# Patient Record
Sex: Female | Born: 1935 | Race: White | Hispanic: No | Smoking: Never smoker
Health system: Southern US, Community
[De-identification: ages and names within clinical notes are randomized; demographics above are authoritative.]

## PROBLEM LIST (undated history)

## (undated) DIAGNOSIS — H3320 Serous retinal detachment, unspecified eye: Secondary | ICD-10-CM

## (undated) DIAGNOSIS — E785 Hyperlipidemia, unspecified: Secondary | ICD-10-CM

## (undated) DIAGNOSIS — E7800 Pure hypercholesterolemia, unspecified: Secondary | ICD-10-CM

## (undated) HISTORY — DX: Pure hypercholesterolemia, unspecified: E78.00

## (undated) HISTORY — PX: OTHER SURGICAL HISTORY: SHX169

## (undated) HISTORY — DX: Hyperlipidemia, unspecified: E78.5

---

## 1997-06-14 ENCOUNTER — Ambulatory Visit (HOSPITAL_COMMUNITY): Admission: RE | Admit: 1997-06-14 | Discharge: 1997-06-14 | Payer: Self-pay | Admitting: *Deleted

## 1997-06-21 ENCOUNTER — Ambulatory Visit (HOSPITAL_COMMUNITY): Admission: RE | Admit: 1997-06-21 | Discharge: 1997-06-21 | Payer: Self-pay | Admitting: *Deleted

## 1998-01-30 ENCOUNTER — Ambulatory Visit (HOSPITAL_COMMUNITY): Admission: RE | Admit: 1998-01-30 | Discharge: 1998-01-30 | Payer: Self-pay | Admitting: *Deleted

## 1998-09-22 ENCOUNTER — Ambulatory Visit (HOSPITAL_COMMUNITY): Admission: RE | Admit: 1998-09-22 | Discharge: 1998-09-22 | Payer: Self-pay | Admitting: *Deleted

## 1998-09-22 ENCOUNTER — Encounter: Payer: Self-pay | Admitting: *Deleted

## 1998-12-29 ENCOUNTER — Other Ambulatory Visit: Admission: RE | Admit: 1998-12-29 | Discharge: 1998-12-29 | Payer: Self-pay | Admitting: *Deleted

## 1999-04-16 ENCOUNTER — Other Ambulatory Visit: Admission: RE | Admit: 1999-04-16 | Discharge: 1999-04-16 | Payer: Self-pay | Admitting: *Deleted

## 1999-10-20 ENCOUNTER — Encounter: Payer: Self-pay | Admitting: Obstetrics and Gynecology

## 1999-10-20 ENCOUNTER — Ambulatory Visit (HOSPITAL_COMMUNITY): Admission: RE | Admit: 1999-10-20 | Discharge: 1999-10-20 | Payer: Self-pay | Admitting: Obstetrics and Gynecology

## 1999-12-02 ENCOUNTER — Ambulatory Visit (HOSPITAL_COMMUNITY): Admission: RE | Admit: 1999-12-02 | Discharge: 1999-12-02 | Payer: Self-pay | Admitting: Gastroenterology

## 1999-12-31 ENCOUNTER — Encounter (INDEPENDENT_AMBULATORY_CARE_PROVIDER_SITE_OTHER): Payer: Self-pay | Admitting: Specialist

## 1999-12-31 ENCOUNTER — Ambulatory Visit (HOSPITAL_COMMUNITY): Admission: RE | Admit: 1999-12-31 | Discharge: 1999-12-31 | Payer: Self-pay | Admitting: Obstetrics and Gynecology

## 2001-01-05 ENCOUNTER — Ambulatory Visit (HOSPITAL_COMMUNITY): Admission: RE | Admit: 2001-01-05 | Discharge: 2001-01-05 | Payer: Self-pay | Admitting: Obstetrics and Gynecology

## 2001-01-05 ENCOUNTER — Encounter: Payer: Self-pay | Admitting: Obstetrics and Gynecology

## 2001-11-20 ENCOUNTER — Encounter: Admission: RE | Admit: 2001-11-20 | Discharge: 2001-11-20 | Payer: Self-pay | Admitting: Internal Medicine

## 2001-11-20 ENCOUNTER — Ambulatory Visit (HOSPITAL_COMMUNITY): Admission: RE | Admit: 2001-11-20 | Discharge: 2001-11-20 | Payer: Self-pay | Admitting: Internal Medicine

## 2001-11-20 ENCOUNTER — Encounter: Payer: Self-pay | Admitting: Internal Medicine

## 2002-06-21 ENCOUNTER — Encounter: Payer: Self-pay | Admitting: Obstetrics and Gynecology

## 2002-06-21 ENCOUNTER — Ambulatory Visit (HOSPITAL_COMMUNITY): Admission: RE | Admit: 2002-06-21 | Discharge: 2002-06-21 | Payer: Self-pay | Admitting: Obstetrics and Gynecology

## 2003-10-28 ENCOUNTER — Ambulatory Visit (HOSPITAL_COMMUNITY): Admission: RE | Admit: 2003-10-28 | Discharge: 2003-10-28 | Payer: Self-pay | Admitting: Obstetrics and Gynecology

## 2003-12-30 ENCOUNTER — Other Ambulatory Visit: Admission: RE | Admit: 2003-12-30 | Discharge: 2003-12-30 | Payer: Self-pay | Admitting: Obstetrics and Gynecology

## 2004-01-02 ENCOUNTER — Encounter: Admission: RE | Admit: 2004-01-02 | Discharge: 2004-01-02 | Payer: Self-pay | Admitting: Obstetrics and Gynecology

## 2005-01-25 ENCOUNTER — Other Ambulatory Visit: Admission: RE | Admit: 2005-01-25 | Discharge: 2005-01-25 | Payer: Self-pay | Admitting: Obstetrics and Gynecology

## 2005-06-11 ENCOUNTER — Ambulatory Visit (HOSPITAL_COMMUNITY): Admission: RE | Admit: 2005-06-11 | Discharge: 2005-06-11 | Payer: Self-pay | Admitting: Obstetrics and Gynecology

## 2006-05-11 ENCOUNTER — Other Ambulatory Visit: Admission: RE | Admit: 2006-05-11 | Discharge: 2006-05-11 | Payer: Self-pay | Admitting: Obstetrics and Gynecology

## 2006-11-09 ENCOUNTER — Ambulatory Visit (HOSPITAL_COMMUNITY): Admission: RE | Admit: 2006-11-09 | Discharge: 2006-11-09 | Payer: Self-pay | Admitting: Family Medicine

## 2008-12-27 ENCOUNTER — Ambulatory Visit (HOSPITAL_COMMUNITY): Admission: RE | Admit: 2008-12-27 | Discharge: 2008-12-27 | Payer: Self-pay | Admitting: Obstetrics and Gynecology

## 2009-01-24 ENCOUNTER — Other Ambulatory Visit: Admission: RE | Admit: 2009-01-24 | Discharge: 2009-01-24 | Payer: Self-pay | Admitting: Obstetrics and Gynecology

## 2009-12-17 ENCOUNTER — Ambulatory Visit (HOSPITAL_COMMUNITY): Admission: RE | Admit: 2009-12-17 | Discharge: 2009-12-17 | Payer: Self-pay | Admitting: Ophthalmology

## 2010-02-05 ENCOUNTER — Ambulatory Visit (HOSPITAL_COMMUNITY): Admission: AD | Admit: 2010-02-05 | Discharge: 2010-02-05 | Payer: Self-pay | Admitting: Ophthalmology

## 2010-06-15 ENCOUNTER — Ambulatory Visit: Payer: Self-pay | Admitting: Cardiology

## 2010-06-25 ENCOUNTER — Encounter: Payer: Self-pay | Admitting: Cardiology

## 2010-06-25 DIAGNOSIS — E785 Hyperlipidemia, unspecified: Secondary | ICD-10-CM | POA: Insufficient documentation

## 2010-06-25 DIAGNOSIS — I119 Hypertensive heart disease without heart failure: Secondary | ICD-10-CM | POA: Insufficient documentation

## 2010-06-25 DIAGNOSIS — E7800 Pure hypercholesterolemia, unspecified: Secondary | ICD-10-CM | POA: Insufficient documentation

## 2010-07-01 LAB — CBC
MCH: 31 pg (ref 26.0–34.0)
MCHC: 33.8 g/dL (ref 30.0–36.0)
MCV: 91.7 fL (ref 78.0–100.0)
Platelets: 240 10*3/uL (ref 150–400)
RDW: 14.3 % (ref 11.5–15.5)

## 2010-07-01 LAB — BASIC METABOLIC PANEL
BUN: 15 mg/dL (ref 6–23)
CO2: 29 mEq/L (ref 19–32)
Chloride: 103 mEq/L (ref 96–112)
Creatinine, Ser: 1.11 mg/dL (ref 0.4–1.2)
Glucose, Bld: 101 mg/dL — ABNORMAL HIGH (ref 70–99)

## 2010-07-03 LAB — BASIC METABOLIC PANEL
CO2: 26 mEq/L (ref 19–32)
Calcium: 10.5 mg/dL (ref 8.4–10.5)
Creatinine, Ser: 1.19 mg/dL (ref 0.4–1.2)
Glucose, Bld: 110 mg/dL — ABNORMAL HIGH (ref 70–99)
Sodium: 142 mEq/L (ref 135–145)

## 2010-07-03 LAB — CBC
Hemoglobin: 13.8 g/dL (ref 12.0–15.0)
MCH: 30.6 pg (ref 26.0–34.0)
MCHC: 33.5 g/dL (ref 30.0–36.0)

## 2010-07-15 ENCOUNTER — Other Ambulatory Visit (INDEPENDENT_AMBULATORY_CARE_PROVIDER_SITE_OTHER): Payer: Medicare Other | Admitting: *Deleted

## 2010-07-15 DIAGNOSIS — R0789 Other chest pain: Secondary | ICD-10-CM

## 2010-07-15 DIAGNOSIS — E7800 Pure hypercholesterolemia, unspecified: Secondary | ICD-10-CM

## 2010-07-15 LAB — LIPID PANEL
HDL: 55 mg/dL (ref >39)
LDL Cholesterol: 129 mg/dL — ABNORMAL HIGH (ref 0–99)
Total CHOL/HDL Ratio: 4.1 Ratio
Triglycerides: 193 mg/dL — ABNORMAL HIGH (ref <150)
VLDL: 39 mg/dL (ref 0–40)

## 2010-07-15 LAB — COMPREHENSIVE METABOLIC PANEL
Alkaline Phosphatase: 121 U/L — ABNORMAL HIGH (ref 39–117)
Glucose, Bld: 97 mg/dL (ref 70–99)
Sodium: 134 mEq/L — ABNORMAL LOW (ref 135–145)
Total Bilirubin: 0.9 mg/dL (ref 0.3–1.2)
Total Protein: 7 g/dL (ref 6.0–8.3)

## 2010-07-17 ENCOUNTER — Other Ambulatory Visit: Payer: Self-pay

## 2010-07-20 ENCOUNTER — Encounter: Payer: Self-pay | Admitting: Cardiology

## 2010-07-20 ENCOUNTER — Ambulatory Visit (INDEPENDENT_AMBULATORY_CARE_PROVIDER_SITE_OTHER): Payer: Medicare Other | Admitting: Cardiology

## 2010-07-20 VITALS — BP 140/70 | HR 68 | Wt 162.0 lb

## 2010-07-20 DIAGNOSIS — E7800 Pure hypercholesterolemia, unspecified: Secondary | ICD-10-CM

## 2010-07-21 NOTE — Progress Notes (Signed)
Office Note   Name: Christine Davila Date of birth: [birth year] Date of service: 07/20/10  History of present illness: This pleasant middle-aged Caucasian female is seen for a scheduled followup office visit.  She has a past history of high blood pressure and hypercholesterolemia.  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  She's had a total of 4 I operations and she still having difficulty.  She is unable to lie flat on her back because of the treatment for her retinal detachment.  She presently has a bottle of oral being used to try to seal the detachment.  Her ophthalmologist took her off Lipitor because he felt it was bad for her eyes.  She is taking vitamin B super complex.  Medications: Reviewed in detail  Physical exam: Her weight is 162.  The blood pressures 140/70.  The pulse is 68 and regular.  The general appearance reveals a well-developed well-nourished woman in no distress.  Her right eye conjunctiva is slightly reddened. Pupils equal and reactive.   Extraocular Movements are full.  There is no scleral icterus.  The mouth and pharynx are normal.  The neck is supple.  The carotids reveal no bruits.  The jugular venous pressure is normal.  The thyroid is not enlarged.  There is no lymphadenopathy. The chest is clear to percussion and auscultation. There are no rales or rhonchi. Expansion of the chest is symmetrical. The precordium is quiet.  The first heart sound is normal.  The second heart sound is physiologically split.  There is no murmur gallop rub or click.  There is no abnormal lift or heave. The abdomen is soft and nontender. Bowel sounds are normal. The liver and spleen are not enlarged. There Are no abdominal masses. There are no bruits.  Normal extremity without phlebitis or edema.  Impression : Essential hypertension 5 pound weight gain secondary to inability to exercise because of detached  retina Hypercholesterolemia worse because of inability to exercise and because of weight gain   Plan: Continue present medication.  Try to keep diet and weight under control.  Recheck in 6 months for followup office visit fasting lipid panel and chemistries.   Signed by Cassell Clement, M.D.  cc:[default value]

## 2010-09-04 NOTE — H&P (Signed)
[facility]  Patient:    Christine Davila, Christine Davila                   MRN: [id number] Adm. Date:  [id number] Disc. Date: [id number] Attending:  Daine Gip CC:         Recovery Room, [facility]   History and Physical  HISTORY OF PRESENT ILLNESS:  The patient is  76 year old gravida 3, para 3 white female admitted for [facility] hysteroscopy for endometrial polyp.  The patient underwent a sonohistogram and at that time was found to have a 1.0 x 0.4 x 0.8 cm mass thought to be probable polyp.  She has been subsequently admitted for [facility] hysteroscopy to remove the polyp.  Risks of surgery, including anesthetic complication, hemorrhage infection, damage to adjacent structures, including bladder, bowel, blood vessels ureters were discussed with the patient.  She was made ware of the risk of perforation which could result in overwhelming life threatening hemorrhage requiring emergent hysterectomy or uterine perforation which could result in bowel damage requiring emergent colostomy. The patient expresses understanding of and acceptance of these risks.  PAST MEDICAL HISTORY:  History of hypertension, history of ileus treated medically approximately 10 years ago. History of colon polyp which was dysplastic and had been removed.  A recent colonoscopy was negative.  D&C, due to ______  bleeding approximately 30 years ago.  Spontaneous vaginal delivery x 3.  MEDICATIONS:  Accupril 10 mg daily, Claritin 10 mg daily, Estrace 1 mg p.o. q.d., Provera 5 mg p.o. q.d.  ALLERGIES:  No known drug allergies.  FAMILY HISTORY:  History of hypertension in patients mother and father, history of diabetes in the patients mother.  REVIEW OF SYSTEMS:  Negative.  PHYSICAL EXAMINATION:  HEENT:  Normal.  NECK:  Supple without thyromegaly.  LUNGS:  Clear to auscultation.  CARDIAC EXAM:  Regular rate and rhythm.  BREASTS:  Without masses no dimpling or  retraction.  ABDOMEN:  Soft, nontender, no hepatosplenomegaly.  PELVIC EXAM:  Uterus position normal mobile, no adnexal mass palpated. rectal is confirmatory, no mass.  ASSESSMENT AND PLAN:  The patient is a 76 year old gravida 3, para 3 with endometrial polyp admitted for D&C hysteroscopy to remove polyp.  Risks have been explained to the patient and she expresses understanding of it and acceptance of these risks. DD:  12/31/99 TD:  12/31/99 Job: [id number]

## 2010-09-04 NOTE — Op Note (Signed)
[facility]  Patient:    Christine Davila, Christine Davila                   MRN: [id number] Proc. Date: 12/31/99 Adm. Date:  [id number] Attending:  Madelyn Flavors                           Operative Report  PREOPERATIVE DIAGNOSES:       1.  Postmenopausal bleeding.                               2.  Endometrial polyp (on sonohistogram).  POSTOPERATIVE DIAGNOSES:      1.  Postmenopausal bleeding.                               2.  Endometrial polyp (on sonohistogram).  PROCEDURE:                    1.  Dilatation and curettage.                               2.  Hysteroscopy.                               3.  Polypectomy.  SURGEON:                      Beather Arbour. Thomasena Edis, M.D.  ANESTHESIA:                   Monitored anesthesia care, as well as                               10 cc 1% lidocaine.  Paracervical block.  ESTIMATED BLOOD LOSS:         Minimal.  FLUIDS:                       1450 cc Crystalloid.  COMPLICATIONS:                None.  DRAINS:                       None.  DESCRIPTION OF PROCEDURE:     The patient was brought to the operating room and identified on the operating table.  After the patient was adequately sedated with monitored anesthesia care, she was placed in the dorsolithotomy position and prepped and draped in the usual sterile fashion.  The bladder was straight catheterized of approximately 100 cc of clear yellow urine. Examination of uterine and cervix revealed the uterus to be anteverted, 6-weeks in level; no adnexal mass palpated.  The speculum was placed in the anterior lip of the cervix; infiltrated with 1 cc of 1% lidocaine and grasped with a single-tooth tenaculum.  The remaining 9 cc of 1% lidocaine were placed for a paracervical block.  The cervix was very carefully and gently dilated up to a #25 Pratt dilator.  The uterus was sounded to 8 cm.  Using the [facility] and ______ scope, the hysteroscope was placed. Using Sorbitol  as a distending medium, a careful and thorough hysteroscopic  examination was performed.  There was noted to be increased endometrial tissue of the anterior and posterior portions of the uterus.  The polyp was identified.  Using the serrated curet, the uterus was curetted in a systematic clockwise fashion, with tissue obtained.  The polyp was also obtained.  _______stone forceps were placed and additional tissue was obtained and sent to pathology for examination.  The hysteroscope was again placed and the endometrium was noted to have been sampled in its entirety, and the polyp was noted to have been removed in its entirety.  At that point the procedure was then terminated.  The tenaculum was removed.  There was noted to be no bleeding at the tenaculum site.  There was noted to be minimal uterine bleeding.  The patient tolerated the procedure well without apparent complications.  She was transferred to the recovery room in stable condition, after sponge, needle and instrument counts were correct.  She was given the postoperative D&C instruction sheet.  She was urged to call for any problems.  She is urged to take ibuprofen 400-600 mg q.6h. p.r.n. for pain.  She is to call for any problems.  She is to refrain from intercourse for two to three weeks.  She will return to the office in two to three weeks for postoperative evaluation. DD:  12/31/99 TD:  01/01/00 Job: [id number]

## 2010-12-24 ENCOUNTER — Other Ambulatory Visit: Payer: Self-pay | Admitting: Cardiology

## 2010-12-24 DIAGNOSIS — E785 Hyperlipidemia, unspecified: Secondary | ICD-10-CM

## 2010-12-24 DIAGNOSIS — E7800 Pure hypercholesterolemia, unspecified: Secondary | ICD-10-CM

## 2010-12-28 ENCOUNTER — Ambulatory Visit (INDEPENDENT_AMBULATORY_CARE_PROVIDER_SITE_OTHER): Payer: Medicare Other | Admitting: *Deleted

## 2010-12-28 ENCOUNTER — Other Ambulatory Visit: Payer: Medicare Other | Admitting: *Deleted

## 2010-12-28 ENCOUNTER — Telehealth: Payer: Self-pay | Admitting: Cardiology

## 2010-12-28 DIAGNOSIS — E7800 Pure hypercholesterolemia, unspecified: Secondary | ICD-10-CM

## 2010-12-28 DIAGNOSIS — E785 Hyperlipidemia, unspecified: Secondary | ICD-10-CM

## 2010-12-28 DIAGNOSIS — I10 Essential (primary) hypertension: Secondary | ICD-10-CM

## 2010-12-28 LAB — CBC WITH DIFFERENTIAL/PLATELET
Basophils Relative: 0.9 % (ref 0.0–3.0)
Eosinophils Relative: 2 % (ref 0.0–5.0)
HCT: 43.5 % (ref 36.0–46.0)
Hemoglobin: 14.4 g/dL (ref 12.0–15.0)
Lymphs Abs: 1.1 10*3/uL (ref 0.7–4.0)
MCV: 92.5 fl (ref 78.0–100.0)
Monocytes Absolute: 0.6 10*3/uL (ref 0.1–1.0)
RBC: 4.71 Mil/uL (ref 3.87–5.11)
WBC: 6 10*3/uL (ref 4.5–10.5)

## 2010-12-28 NOTE — Telephone Encounter (Signed)
Spoke with June and was told already done.

## 2010-12-28 NOTE — Telephone Encounter (Signed)
Called because she had her blood work done this morning and for some reason Dr. B had not ordered a CBC. They had drawn some extra blood but said that an order needs to be placed before they could process it. No need to call back unless there is an issue.

## 2010-12-29 ENCOUNTER — Other Ambulatory Visit: Payer: Self-pay | Admitting: *Deleted

## 2010-12-30 LAB — HEPATIC FUNCTION PANEL
AST: 26 U/L (ref 0–37)
Albumin: 3.9 g/dL (ref 3.5–5.2)
Total Protein: 6.8 g/dL (ref 6.0–8.3)

## 2010-12-30 LAB — BASIC METABOLIC PANEL
BUN: 17 mg/dL (ref 6–23)
Calcium: 9.7 mg/dL (ref 8.4–10.5)
GFR: 61.02 mL/min (ref >60.00)
Glucose, Bld: 96 mg/dL (ref 70–99)

## 2011-01-01 ENCOUNTER — Telehealth: Payer: Self-pay | Admitting: Cardiology

## 2011-01-01 ENCOUNTER — Ambulatory Visit (INDEPENDENT_AMBULATORY_CARE_PROVIDER_SITE_OTHER): Payer: Medicare Other | Admitting: Cardiology

## 2011-01-01 ENCOUNTER — Encounter: Payer: Self-pay | Admitting: Cardiology

## 2011-01-01 VITALS — BP 136/70 | HR 72 | Wt 159.0 lb

## 2011-01-01 DIAGNOSIS — E785 Hyperlipidemia, unspecified: Secondary | ICD-10-CM

## 2011-01-01 DIAGNOSIS — I119 Hypertensive heart disease without heart failure: Secondary | ICD-10-CM

## 2011-01-01 DIAGNOSIS — E7800 Pure hypercholesterolemia, unspecified: Secondary | ICD-10-CM

## 2011-01-01 DIAGNOSIS — I10 Essential (primary) hypertension: Secondary | ICD-10-CM

## 2011-01-01 NOTE — Progress Notes (Signed)
Christine Davila Date of Birth:  [birth year] [facility] [address]   [address] [place], [place]  [address] [phone]           Fax   [phone]  HPI: This pleasant 76 year old woman is seen for a scheduled followup office visit.  She has a past history of high blood pressure and hypercholesterolemia.  She did well until August 2011 when she developed a sudden detached retina of her right eye.  She's had multiple surgical procedures and still is not seeing well.  She is due to have some additional laser surgery on her eye in the next several weeks.  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 aspirin 81 MG tablet Take 81 mg by mouth daily.        Marland Kitchen b complex vitamins tablet Take 1 tablet by mouth daily. Taking super b complex daily       . Calcium Carbonate-Vit D-Min (CALCIUM 1200 PO) Take by mouth daily.        . Cholecalciferol (VITAMIN D PO) Take 600 mg by mouth daily.        . cyclobenzaprine (FLEXERIL) 10 MG tablet Take 10 mg by mouth 3 (three) times daily as needed.        . fexofenadine (ALLEGRA) 180 MG tablet Take 180 mg by mouth daily.        . Flaxseed, Linseed, (FLAX SEED OIL PO) Take by mouth. As directed       . GLUCOSAMINE PO Take 750 mg by mouth daily.        Marland Kitchen losartan (COZAAR) 100 MG tablet Take 100 mg by mouth daily.        . metoprolol tartrate (LOPRESSOR) 25 MG tablet Take 25 mg by mouth 2 (two) times daily.        . Multiple Vitamin (MULTIVITAMIN) tablet Take 1 tablet by mouth daily.        . Travoprost, BAK Free, (TRAVATAMN) 0.004 % SOLN ophthalmic solution 1 drop at bedtime.        . Triamterene-HCTZ (MAXZIDE-25 PO) Take by mouth daily.          Allergies  Allergen Reactions  . Ace Inhibitors Cough  . Lipitor (Atorvastatin Calcium)     Eye problems      Patient Active Problem List  Diagnoses  . Essential hypertension  . Hypercholesterolemia  . Hyperlipidemia  . Hyperlipidemia    History  Smoking status  . Never Smoker   Smokeless tobacco  . Not on file    History  Alcohol Use: Not on file    No family history on file.  Review of Systems: The patient denies any heat or cold intolerance.  No weight gain or weight loss.  The patient denies headaches or blurry vision.  There is no cough or sputum production.  The patient denies dizziness.  There is no hematuria or hematochezia.  The patient denies any muscle aches or arthritis.  The patient denies any rash.  The patient denies frequent falling or instability.  There is no history of depression or anxiety.  All other systems were reviewed and are negative.   Physical Exam: Filed Vitals:   01/01/11 0838  BP: 136/70  Pulse: 72  The general appearance reveals a well-developed well-nourished woman  in no distress.Pupils equal and reactive.   Extraocular Movements are full.  There is no scleral icterus.  The mouth and pharynx are normal.  The neck is supple.  The carotids reveal no bruits.  The jugular venous pressure is normal.  The thyroid is not enlarged.  There is no lymphadenopathy.  The chest is clear to percussion and auscultation. There are no rales or rhonchi. Expansion of the chest is symmetrical.  The precordium is quiet.  The first heart sound is normal.  The second heart sound is physiologically split.  There is no murmur gallop rub or click.  There is no abnormal lift or heave.  The abdomen is soft and nontender. Bowel sounds are normal. The liver and spleen are not enlarged. There Are no abdominal masses. There are no bruits.  The pedal pulses are good.  There is no phlebitis or edema.  There is no cyanosis or clubbing.  Strength is normal and symmetrical in all extremities.  There is no lateralizing weakness.  There are no sensory deficits.  The skin is warm and  dry.  There is no rash.      Assessment / Plan: Continue same medication.  Recheck in 6 months for office visit and fasting lab work

## 2011-01-01 NOTE — Telephone Encounter (Signed)
Pt calling back with name of med she is taking anf couldn't remember the name it's flax seed oil

## 2011-01-01 NOTE — Assessment & Plan Note (Signed)
The patient has not been expressing any symptoms from her blood pressure.  Denies headaches or dizzy spells.

## 2011-01-01 NOTE — Assessment & Plan Note (Signed)
The patient is no longer on Lipitor.  She took herself off Lipitor.  She has been taking a garlic pill each day and another supplement.  Her blood work this time is better than last time.  She has also been eating eggs at the encouragement of her eye doctor who thought it might help her retinal disease.

## 2011-04-22 DIAGNOSIS — H3340 Traction detachment of retina, unspecified eye: Secondary | ICD-10-CM | POA: Diagnosis not present

## 2011-04-22 DIAGNOSIS — H35419 Lattice degeneration of retina, unspecified eye: Secondary | ICD-10-CM | POA: Diagnosis not present

## 2011-04-22 DIAGNOSIS — H4720 Unspecified optic atrophy: Secondary | ICD-10-CM | POA: Diagnosis not present

## 2011-04-22 DIAGNOSIS — H40019 Open angle with borderline findings, low risk, unspecified eye: Secondary | ICD-10-CM | POA: Diagnosis not present

## 2011-04-26 ENCOUNTER — Other Ambulatory Visit: Payer: Self-pay | Admitting: *Deleted

## 2011-04-26 MED ORDER — TRIAMTERENE-HCTZ 37.5-25 MG PO TABS: 1.0000 | ORAL_TABLET | Freq: Every day | ORAL | Status: DC

## 2011-04-26 MED ORDER — LOSARTAN POTASSIUM 100 MG PO TABS: 100.0000 mg | ORAL_TABLET | Freq: Every day | ORAL | Status: DC

## 2011-04-26 NOTE — Telephone Encounter (Signed)
Refilled triam/hctz and losartan 

## 2011-04-26 NOTE — Telephone Encounter (Signed)
Refilled triam/hctz and losartan

## 2011-04-28 DIAGNOSIS — H4011X0 Primary open-angle glaucoma, stage unspecified: Secondary | ICD-10-CM | POA: Diagnosis not present

## 2011-04-28 DIAGNOSIS — H26499 Other secondary cataract, unspecified eye: Secondary | ICD-10-CM | POA: Diagnosis not present

## 2011-04-28 DIAGNOSIS — Z961 Presence of intraocular lens: Secondary | ICD-10-CM | POA: Diagnosis not present

## 2011-04-28 DIAGNOSIS — H409 Unspecified glaucoma: Secondary | ICD-10-CM | POA: Diagnosis not present

## 2011-05-12 DIAGNOSIS — E785 Hyperlipidemia, unspecified: Secondary | ICD-10-CM | POA: Diagnosis not present

## 2011-05-12 DIAGNOSIS — Z124 Encounter for screening for malignant neoplasm of cervix: Secondary | ICD-10-CM | POA: Diagnosis not present

## 2011-05-12 DIAGNOSIS — M899 Disorder of bone, unspecified: Secondary | ICD-10-CM | POA: Diagnosis not present

## 2011-05-12 DIAGNOSIS — M949 Disorder of cartilage, unspecified: Secondary | ICD-10-CM | POA: Diagnosis not present

## 2011-05-12 DIAGNOSIS — I10 Essential (primary) hypertension: Secondary | ICD-10-CM | POA: Diagnosis not present

## 2011-05-12 DIAGNOSIS — M722 Plantar fascial fibromatosis: Secondary | ICD-10-CM | POA: Diagnosis not present

## 2011-05-12 DIAGNOSIS — Z0000 Encounter for general adult medical examination without abnormal findings: Secondary | ICD-10-CM | POA: Diagnosis not present

## 2011-05-13 DIAGNOSIS — R82998 Other abnormal findings in urine: Secondary | ICD-10-CM | POA: Diagnosis not present

## 2011-05-14 DIAGNOSIS — Z1212 Encounter for screening for malignant neoplasm of rectum: Secondary | ICD-10-CM | POA: Diagnosis not present

## 2011-05-20 ENCOUNTER — Other Ambulatory Visit (HOSPITAL_COMMUNITY): Payer: Self-pay | Admitting: Internal Medicine

## 2011-05-20 DIAGNOSIS — Z1231 Encounter for screening mammogram for malignant neoplasm of breast: Secondary | ICD-10-CM

## 2011-05-31 DIAGNOSIS — H4011X0 Primary open-angle glaucoma, stage unspecified: Secondary | ICD-10-CM | POA: Diagnosis not present

## 2011-06-17 ENCOUNTER — Ambulatory Visit (HOSPITAL_COMMUNITY)
Admission: RE | Admit: 2011-06-17 | Discharge: 2011-06-17 | Disposition: A | Discharge status: Home / Self Care | Payer: Medicare Other | Source: Ambulatory Visit | Attending: Internal Medicine | Admitting: Internal Medicine

## 2011-06-17 DIAGNOSIS — Z1231 Encounter for screening mammogram for malignant neoplasm of breast: Secondary | ICD-10-CM

## 2011-06-23 DIAGNOSIS — N390 Urinary tract infection, site not specified: Secondary | ICD-10-CM | POA: Diagnosis not present

## 2011-07-22 ENCOUNTER — Other Ambulatory Visit: Payer: Medicare Other

## 2011-07-22 ENCOUNTER — Ambulatory Visit (INDEPENDENT_AMBULATORY_CARE_PROVIDER_SITE_OTHER): Payer: Medicare Other | Admitting: Cardiology

## 2011-07-22 ENCOUNTER — Encounter: Payer: Self-pay | Admitting: Cardiology

## 2011-07-22 VITALS — BP 128/78 | HR 60 | Ht 65.0 in | Wt 150.0 lb

## 2011-07-22 DIAGNOSIS — I119 Hypertensive heart disease without heart failure: Secondary | ICD-10-CM | POA: Diagnosis not present

## 2011-07-22 DIAGNOSIS — E7800 Pure hypercholesterolemia, unspecified: Secondary | ICD-10-CM | POA: Diagnosis not present

## 2011-07-22 DIAGNOSIS — I10 Essential (primary) hypertension: Secondary | ICD-10-CM | POA: Diagnosis not present

## 2011-07-22 MED ORDER — PROMETHAZINE HCL 25 MG PO TABS: 25.0000 mg | ORAL_TABLET | Freq: Four times a day (QID) | ORAL | Status: DC | PRN

## 2011-07-22 NOTE — Progress Notes (Signed)
Christine Davila Date of Birth:  02-Jul-1934 [facility] [address] [place], [place]  [address] [phone]  Fax   [phone]  HPI: This pleasant 77 year old woman is seen for a six-month followup office visit.  She has a past history of essential hypertension and history of hypercholesterolemia.  His last visit she has been feeling well.  Her eyesight has improved since last visit.  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  Current Outpatient Prescriptions  Medication Sig Dispense Refill  . aspirin 81 MG tablet Take 81 mg by mouth daily.        Marland Kitchen b complex vitamins tablet Take 1 tablet by mouth daily. Taking super b complex daily       . Calcium Carbonate-Vit D-Min (CALCIUM 1200 PO) Take by mouth daily.        . Cholecalciferol (VITAMIN D PO) Take 1,000 mg by mouth daily.       . cyclobenzaprine (FLEXERIL) 10 MG tablet Take 10 mg by mouth 3 (three) times daily as needed.        . fexofenadine (ALLEGRA) 180 MG tablet Take 180 mg by mouth daily. As needed      . Flaxseed, Linseed, (FLAX SEED OIL PO) Take by mouth. As directed       . GARLIC PO Take by mouth daily.      Marland Kitchen GLUCOSAMINE PO Take 750 mg by mouth daily.        Marland Kitchen losartan (COZAAR) 100 MG tablet Take 1 tablet (100 mg total) by mouth daily.  90 tablet  3  . metoprolol tartrate (LOPRESSOR) 25 MG tablet Take 25 mg by mouth 2 (two) times daily.        . Multiple Vitamin (MULTIVITAMIN) tablet Take 1 tablet by mouth daily.        . Multiple Vitamins-Minerals (EYE VITAMINS) CAPS Take by mouth daily.      . Travoprost, BAK Free, (TRAVATAMN) 0.004 % SOLN ophthalmic solution 1 drop at bedtime.        . triamterene-hydrochlorothiazide (MAXZIDE-25) 37.5-25 MG per tablet Take 1 each (1 tablet total) by mouth daily.  90 tablet  3  . promethazine (PHENERGAN) 25 MG tablet Take 1 tablet (25 mg  total) by mouth every 6 (six) hours as needed for nausea.  30 tablet  2    Allergies  Allergen Reactions  . Ace Inhibitors Cough  . Lipitor (Atorvastatin Calcium)     Eye problems    Patient Active Problem List  Diagnoses  . Essential hypertension  . Hypercholesterolemia  . Hyperlipidemia  . Hyperlipidemia    History  Smoking status  . Never Smoker   Smokeless tobacco  . Not on file    History  Alcohol Use: Not on file    No family history on file.  Review of Systems: The patient denies any heat or cold intolerance.  No weight gain or weight loss.  The patient denies headaches or blurry vision.  There is no cough or sputum production.  The patient denies dizziness.  There is no hematuria or hematochezia.  The patient denies any muscle aches or arthritis.  The patient denies any rash.  The patient denies frequent falling or instability.  There is no history of depression or anxiety.  All other systems were reviewed and are negative.   Physical Exam:  Filed Vitals:   07/22/11 0841  BP: 128/78  Pulse: 60   the general appearance reveals a well-developed well-nourished woman in no distress.The head and neck exam reveals pupils equal and reactive.  Extraocular movements are full.  There is no scleral icterus.  The mouth and pharynx are normal.  The neck is supple.  The carotids reveal no bruits.  The jugular venous pressure is normal.  The  thyroid is not enlarged.  There is no lymphadenopathy.  The chest is clear to percussion and auscultation.  There are no rales or rhonchi.  Expansion of the chest is symmetrical.  The precordium is quiet.  The first heart sound is normal.  The second heart sound is physiologically split.  There is no murmur gallop rub or click.  There is no abnormal lift or heave.  The abdomen is soft and nontender.  The bowel sounds are normal.  The liver and spleen are not enlarged.  There are no abdominal masses.  There are no abdominal bruits.  Extremities  reveal good pedal pulses.  There is no phlebitis or edema.  There is no cyanosis or clubbing.  Strength is normal and symmetrical in all extremities.  There is no lateralizing weakness.  There are no sensory deficits.  The skin is warm and dry.  There is no rash.      Assessment / Plan: Continue present medication.  He gave her a new prescription for Phenergan 25 mg tablets to be used every 6 hours as needed for nausea and she rarely has to use  These  Tablet. Recheck in 6 months for followup office visit and EKG.  Continue careful diet.

## 2011-07-22 NOTE — Assessment & Plan Note (Signed)
Patient has had no symptoms referable to her blood pressure.  She denies any headaches or dizzy spells.  No chest pain or dyspnea.

## 2011-07-22 NOTE — Assessment & Plan Note (Signed)
The patient has a history of hypercholesterolemia treated with diet alone.  She states that she had recent blood work at her primary care provider's office.

## 2011-07-22 NOTE — Patient Instructions (Signed)
Your physician wants you to follow-up in: 6 months with Dr.Brackbill. You will receive a reminder letter in the mail two months in advance. If you don't receive a letter, please call our office to schedule the follow-up appointment.  

## 2011-07-23 ENCOUNTER — Other Ambulatory Visit: Payer: Self-pay | Admitting: *Deleted

## 2011-07-23 MED ORDER — METOPROLOL TARTRATE 25 MG PO TABS: 25.0000 mg | ORAL_TABLET | Freq: Two times a day (BID) | ORAL | Status: DC

## 2011-07-23 NOTE — Telephone Encounter (Signed)
Refilled metoprolol 

## 2011-08-02 DIAGNOSIS — L570 Actinic keratosis: Secondary | ICD-10-CM | POA: Diagnosis not present

## 2011-08-26 DIAGNOSIS — H353190 Nonexudative age-related macular degeneration, unspecified eye, stage unspecified: Secondary | ICD-10-CM | POA: Diagnosis not present

## 2011-08-26 DIAGNOSIS — H35359 Cystoid macular degeneration, unspecified eye: Secondary | ICD-10-CM | POA: Diagnosis not present

## 2011-08-26 DIAGNOSIS — H33109 Unspecified retinoschisis, unspecified eye: Secondary | ICD-10-CM | POA: Diagnosis not present

## 2011-08-26 DIAGNOSIS — H4720 Unspecified optic atrophy: Secondary | ICD-10-CM | POA: Diagnosis not present

## 2011-09-02 DIAGNOSIS — H4011X0 Primary open-angle glaucoma, stage unspecified: Secondary | ICD-10-CM | POA: Diagnosis not present

## 2011-09-17 DIAGNOSIS — H4011X0 Primary open-angle glaucoma, stage unspecified: Secondary | ICD-10-CM | POA: Diagnosis not present

## 2011-10-16 DIAGNOSIS — Z961 Presence of intraocular lens: Secondary | ICD-10-CM | POA: Diagnosis not present

## 2011-10-16 DIAGNOSIS — H4011X0 Primary open-angle glaucoma, stage unspecified: Secondary | ICD-10-CM | POA: Diagnosis not present

## 2011-10-16 DIAGNOSIS — H0230 Blepharochalasis unspecified eye, unspecified eyelid: Secondary | ICD-10-CM | POA: Diagnosis not present

## 2011-11-01 DIAGNOSIS — R82998 Other abnormal findings in urine: Secondary | ICD-10-CM | POA: Diagnosis not present

## 2011-11-01 DIAGNOSIS — I10 Essential (primary) hypertension: Secondary | ICD-10-CM | POA: Diagnosis not present

## 2011-12-23 DIAGNOSIS — R82998 Other abnormal findings in urine: Secondary | ICD-10-CM | POA: Diagnosis not present

## 2011-12-23 DIAGNOSIS — N390 Urinary tract infection, site not specified: Secondary | ICD-10-CM | POA: Diagnosis not present

## 2012-01-20 DIAGNOSIS — L821 Other seborrheic keratosis: Secondary | ICD-10-CM | POA: Diagnosis not present

## 2012-01-20 DIAGNOSIS — D485 Neoplasm of uncertain behavior of skin: Secondary | ICD-10-CM | POA: Diagnosis not present

## 2012-01-20 DIAGNOSIS — IMO0002 Reserved for concepts with insufficient information to code with codable children: Secondary | ICD-10-CM | POA: Diagnosis not present

## 2012-01-20 DIAGNOSIS — D239 Other benign neoplasm of skin, unspecified: Secondary | ICD-10-CM | POA: Diagnosis not present

## 2012-01-20 DIAGNOSIS — L570 Actinic keratosis: Secondary | ICD-10-CM | POA: Diagnosis not present

## 2012-01-20 DIAGNOSIS — L608 Other nail disorders: Secondary | ICD-10-CM | POA: Diagnosis not present

## 2012-02-03 DIAGNOSIS — Z23 Encounter for immunization: Secondary | ICD-10-CM | POA: Diagnosis not present

## 2012-02-04 DIAGNOSIS — H409 Unspecified glaucoma: Secondary | ICD-10-CM | POA: Diagnosis not present

## 2012-02-04 DIAGNOSIS — H4011X0 Primary open-angle glaucoma, stage unspecified: Secondary | ICD-10-CM | POA: Diagnosis not present

## 2012-02-04 DIAGNOSIS — H11159 Pinguecula, unspecified eye: Secondary | ICD-10-CM | POA: Diagnosis not present

## 2012-02-04 DIAGNOSIS — H18419 Arcus senilis, unspecified eye: Secondary | ICD-10-CM | POA: Diagnosis not present

## 2012-02-24 DIAGNOSIS — H6120 Impacted cerumen, unspecified ear: Secondary | ICD-10-CM | POA: Diagnosis not present

## 2012-02-24 DIAGNOSIS — H9209 Otalgia, unspecified ear: Secondary | ICD-10-CM | POA: Diagnosis not present

## 2012-02-28 DIAGNOSIS — H6120 Impacted cerumen, unspecified ear: Secondary | ICD-10-CM | POA: Diagnosis not present

## 2012-02-28 DIAGNOSIS — I789 Disease of capillaries, unspecified: Secondary | ICD-10-CM | POA: Diagnosis not present

## 2012-02-28 DIAGNOSIS — L608 Other nail disorders: Secondary | ICD-10-CM | POA: Diagnosis not present

## 2012-02-28 DIAGNOSIS — L538 Other specified erythematous conditions: Secondary | ICD-10-CM | POA: Diagnosis not present

## 2012-02-28 DIAGNOSIS — R030 Elevated blood-pressure reading, without diagnosis of hypertension: Secondary | ICD-10-CM | POA: Diagnosis not present

## 2012-02-28 DIAGNOSIS — H4720 Unspecified optic atrophy: Secondary | ICD-10-CM | POA: Diagnosis not present

## 2012-02-28 DIAGNOSIS — H33019 Retinal detachment with single break, unspecified eye: Secondary | ICD-10-CM | POA: Diagnosis not present

## 2012-02-28 DIAGNOSIS — H353190 Nonexudative age-related macular degeneration, unspecified eye, stage unspecified: Secondary | ICD-10-CM | POA: Diagnosis not present

## 2012-02-28 DIAGNOSIS — H40019 Open angle with borderline findings, low risk, unspecified eye: Secondary | ICD-10-CM | POA: Diagnosis not present

## 2012-04-27 ENCOUNTER — Other Ambulatory Visit: Payer: Self-pay | Admitting: Cardiology

## 2012-04-27 MED ORDER — TRIAMTERENE-HCTZ 37.5-25 MG PO TABS: 1.0000 | ORAL_TABLET | Freq: Every day | ORAL | Status: DC

## 2012-04-27 MED ORDER — LOSARTAN POTASSIUM 100 MG PO TABS: 100.0000 mg | ORAL_TABLET | Freq: Every day | ORAL | Status: DC

## 2012-05-18 DIAGNOSIS — H18419 Arcus senilis, unspecified eye: Secondary | ICD-10-CM | POA: Diagnosis not present

## 2012-05-18 DIAGNOSIS — H353190 Nonexudative age-related macular degeneration, unspecified eye, stage unspecified: Secondary | ICD-10-CM | POA: Diagnosis not present

## 2012-05-18 DIAGNOSIS — H4011X0 Primary open-angle glaucoma, stage unspecified: Secondary | ICD-10-CM | POA: Diagnosis not present

## 2012-05-18 DIAGNOSIS — H11159 Pinguecula, unspecified eye: Secondary | ICD-10-CM | POA: Diagnosis not present

## 2012-06-26 ENCOUNTER — Other Ambulatory Visit (INDEPENDENT_AMBULATORY_CARE_PROVIDER_SITE_OTHER): Payer: Medicare Other

## 2012-06-26 ENCOUNTER — Other Ambulatory Visit: Payer: Self-pay | Admitting: *Deleted

## 2012-06-26 DIAGNOSIS — I119 Hypertensive heart disease without heart failure: Secondary | ICD-10-CM

## 2012-06-26 LAB — CBC WITH DIFFERENTIAL/PLATELET
Basophils Absolute: 0.1 10*3/uL (ref 0.0–0.1)
Basophils Relative: 1 % (ref 0.0–3.0)
Eosinophils Absolute: 0.2 10*3/uL (ref 0.0–0.7)
Lymphocytes Relative: 18.8 % (ref 12.0–46.0)
MCHC: 33.8 g/dL (ref 30.0–36.0)
Neutrophils Relative %: 69.2 % (ref 43.0–77.0)
RBC: 4.5 Mil/uL (ref 3.87–5.11)

## 2012-06-26 LAB — BASIC METABOLIC PANEL
BUN: 22 mg/dL (ref 6–23)
Chloride: 107 mEq/L (ref 96–112)
Potassium: 3.7 mEq/L (ref 3.5–5.1)

## 2012-06-26 LAB — LIPID PANEL
Cholesterol: 211 mg/dL — ABNORMAL HIGH (ref 0–200)
Triglycerides: 157 mg/dL — ABNORMAL HIGH (ref 0.0–149.0)

## 2012-06-26 LAB — HEPATIC FUNCTION PANEL
ALT: 21 U/L (ref 0–35)
Total Bilirubin: 0.9 mg/dL (ref 0.3–1.2)
Total Protein: 6.8 g/dL (ref 6.0–8.3)

## 2012-06-26 NOTE — Progress Notes (Signed)
Quick Note:  Please make copy of labs for patient visit. ______ 

## 2012-07-03 ENCOUNTER — Ambulatory Visit (INDEPENDENT_AMBULATORY_CARE_PROVIDER_SITE_OTHER): Payer: Medicare Other | Admitting: Cardiology

## 2012-07-03 ENCOUNTER — Encounter: Payer: Self-pay | Admitting: Cardiology

## 2012-07-03 VITALS — BP 140/81 | HR 56 | Ht 65.0 in | Wt 155.4 lb

## 2012-07-03 DIAGNOSIS — E7800 Pure hypercholesterolemia, unspecified: Secondary | ICD-10-CM

## 2012-07-03 DIAGNOSIS — I119 Hypertensive heart disease without heart failure: Secondary | ICD-10-CM

## 2012-07-03 NOTE — Patient Instructions (Addendum)
Work harder on [employer] loss  Your physician recommends that you continue on your current medications as directed. Please refer to the Current Medication list given to you today.  Your physician wants you to follow-up in: 6 months with fasting labs (lp/bmet/hfp)  You will receive a reminder letter in the mail two months in advance. If you don't receive a letter, please call our office to schedule the follow-up appointment.

## 2012-07-03 NOTE — Assessment & Plan Note (Signed)
The patient has a history of hypercholesterolemia.  She does not want to take statin therapy.  She did not tolerate Lipitor.  Her LDL is still running high.  She has been taking some herbal preparations such as garlic.  She has gained 5 pounds since last visit and she will try harder with weight loss and diet.  We will plan to recheck her in 6 months

## 2012-07-03 NOTE — Assessment & Plan Note (Signed)
The patient has not been experiencing any chest pain or shortness of breath.  No headaches dizziness or syncope.  No palpitations.  The patient has been under more stress because her husband has had pneumonia for the past 6 weeks and does not appear to be improving on medicine.

## 2012-07-03 NOTE — Progress Notes (Signed)
Havery Moros Date of Birth:  1934/05/09 [facility] [address] [place], [place]  [address] [phone]  Fax   [phone]  HPI: This pleasant 78 year old woman is seen for a six-month followup office visit. She has a past history of essential hypertension and history of hypercholesterolemia. His last visit she has been feeling well. Her eyesight has improved since last visit. 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  Current Outpatient Prescriptions  Medication Sig Dispense Refill  . aspirin 81 MG tablet Take 81 mg by mouth daily.        Marland Kitchen b complex vitamins tablet Take 1 tablet by mouth daily. Taking super b complex daily       . Calcium Carbonate-Vit D-Min (CALCIUM 1200 PO) Take by mouth daily.        . Cholecalciferol (VITAMIN D PO) Take 1,000 mg by mouth daily.       . Coenzyme Q10 (COQ-10 PO) Take by mouth.      . cyclobenzaprine (FLEXERIL) 10 MG tablet Take 10 mg by mouth 3 (three) times daily as needed.        . dorzolamide-timolol (COSOPT) 22.3-6.8 MG/ML ophthalmic solution 1 drop 2 (two) times daily.      . fexofenadine (ALLEGRA) 180 MG tablet Take 180 mg by mouth daily. As needed      . fish oil-omega-3 fatty acids 1000 MG capsule Take 2 g by mouth daily.      . Flaxseed, Linseed, (FLAX SEED OIL PO) Take by mouth. As directed       . GARLIC PO Take by mouth daily.      Marland Kitchen GLUCOSAMINE PO Take 750 mg by mouth daily.        Marland Kitchen losartan (COZAAR) 100 MG tablet Take 1 tablet (100 mg total) by mouth daily.  30 tablet  1  . metoprolol tartrate (LOPRESSOR) 25 MG tablet Take 1 tablet (25 mg total) by mouth 2 (two) times daily.  180 tablet  11  . Multiple Vitamin (MULTIVITAMIN) tablet Take 1 tablet by mouth daily.        .  Multiple Vitamins-Minerals (EYE VITAMINS) CAPS Take by mouth daily.      . Travoprost, BAK Free, (TRAVATAMN) 0.004 % SOLN ophthalmic solution 1 drop at bedtime.        . triamterene-hydrochlorothiazide (MAXZIDE-25) 37.5-25 MG per tablet Take 1 each (1 tablet total) by mouth daily.  30 tablet  5  . promethazine (PHENERGAN) 25 MG tablet Take 1 tablet (25 mg total) by mouth every 6 (six) hours as needed for nausea.  30 tablet  2  . triamterene-hydrochlorothiazide (MAXZIDE-25) 37.5-25 MG per tablet Take 1 each (1 tablet total) by mouth daily.  90 tablet  3   No current facility-administered medications for this visit.    Allergies  Allergen Reactions  . Ace Inhibitors Cough  . Lipitor (Atorvastatin Calcium)     Eye problems    Patient Active Problem List  Diagnosis  . Essential hypertension  . Hypercholesterolemia  . Hyperlipidemia  . Hyperlipidemia    History  Smoking status  . Never Smoker   Smokeless tobacco  . Not on file    History  Alcohol Use: Not on file  No family history on file.  Review of Systems: The patient denies any heat or cold intolerance.  No weight gain or weight loss.  The patient denies headaches or blurry vision.  There is no cough or sputum production.  The patient denies dizziness.  There is no hematuria or hematochezia.  The patient denies any muscle aches or arthritis.  The patient denies any rash.  The patient denies frequent falling or instability.  There is no history of depression or anxiety.  All other systems were reviewed and are negative.   Physical Exam: Filed Vitals:   07/03/12 0838  BP: 140/81  Pulse: 56   the general appearance reveals a well-developed well-nourished woman in no distress.The head and neck exam reveals pupils equal and reactive.  Extraocular movements are full.  There is no scleral icterus.  The mouth and pharynx are normal.  The neck is supple.  The carotids reveal no bruits.  The jugular venous pressure is normal.   The  thyroid is not enlarged.  There is no lymphadenopathy.  The chest is clear to percussion and auscultation.  There are no rales or rhonchi.  Expansion of the chest is symmetrical.  The precordium is quiet.  The first heart sound is normal.  The second heart sound is physiologically split.  There is no murmur gallop rub or click.  There is no abnormal lift or heave.  The abdomen is soft and nontender.  The bowel sounds are normal.  The liver and spleen are not enlarged.  There are no abdominal masses.  There are no abdominal bruits.  Extremities reveal good pedal pulses.  There is no phlebitis or edema.  There is no cyanosis or clubbing.  Strength is normal and symmetrical in all extremities.  There is no lateralizing weakness.  There are no sensory deficits.  The skin is warm and dry.  There is no rash.  EKG shows sinus bradycardia and no ischemic changes    Assessment / Plan: Continue on same medication.  Recheck in 6 months for followup office visit lipid panel hepatic function panel and basal metabolic panel

## 2012-07-24 ENCOUNTER — Other Ambulatory Visit: Payer: Self-pay | Admitting: *Deleted

## 2012-07-24 MED ORDER — METOPROLOL TARTRATE 25 MG PO TABS: 25.0000 mg | ORAL_TABLET | Freq: Two times a day (BID) | ORAL | Status: DC

## 2012-08-18 DIAGNOSIS — E785 Hyperlipidemia, unspecified: Secondary | ICD-10-CM | POA: Diagnosis not present

## 2012-08-18 DIAGNOSIS — M949 Disorder of cartilage, unspecified: Secondary | ICD-10-CM | POA: Diagnosis not present

## 2012-08-18 DIAGNOSIS — I10 Essential (primary) hypertension: Secondary | ICD-10-CM | POA: Diagnosis not present

## 2012-08-18 DIAGNOSIS — M899 Disorder of bone, unspecified: Secondary | ICD-10-CM | POA: Diagnosis not present

## 2012-08-25 DIAGNOSIS — Z23 Encounter for immunization: Secondary | ICD-10-CM | POA: Diagnosis not present

## 2012-08-25 DIAGNOSIS — M949 Disorder of cartilage, unspecified: Secondary | ICD-10-CM | POA: Diagnosis not present

## 2012-08-25 DIAGNOSIS — M899 Disorder of bone, unspecified: Secondary | ICD-10-CM | POA: Diagnosis not present

## 2012-08-25 DIAGNOSIS — I10 Essential (primary) hypertension: Secondary | ICD-10-CM | POA: Diagnosis not present

## 2012-08-25 DIAGNOSIS — E785 Hyperlipidemia, unspecified: Secondary | ICD-10-CM | POA: Diagnosis not present

## 2012-08-25 DIAGNOSIS — Z0000 Encounter for general adult medical examination without abnormal findings: Secondary | ICD-10-CM | POA: Diagnosis not present

## 2012-08-25 DIAGNOSIS — Z1331 Encounter for screening for depression: Secondary | ICD-10-CM | POA: Diagnosis not present

## 2012-08-25 DIAGNOSIS — IMO0002 Reserved for concepts with insufficient information to code with codable children: Secondary | ICD-10-CM | POA: Diagnosis not present

## 2012-08-25 DIAGNOSIS — J309 Allergic rhinitis, unspecified: Secondary | ICD-10-CM | POA: Diagnosis not present

## 2012-08-28 DIAGNOSIS — H33019 Retinal detachment with single break, unspecified eye: Secondary | ICD-10-CM | POA: Diagnosis not present

## 2012-08-28 DIAGNOSIS — H35419 Lattice degeneration of retina, unspecified eye: Secondary | ICD-10-CM | POA: Diagnosis not present

## 2012-08-28 DIAGNOSIS — H40019 Open angle with borderline findings, low risk, unspecified eye: Secondary | ICD-10-CM | POA: Diagnosis not present

## 2012-09-06 ENCOUNTER — Other Ambulatory Visit: Payer: Self-pay | Admitting: *Deleted

## 2012-09-06 MED ORDER — LOSARTAN POTASSIUM 100 MG PO TABS: 100.0000 mg | ORAL_TABLET | Freq: Every day | ORAL | Status: DC

## 2012-09-06 MED ORDER — TRIAMTERENE-HCTZ 37.5-25 MG PO TABS: 1.0000 | ORAL_TABLET | Freq: Every day | ORAL | Status: DC

## 2012-09-13 ENCOUNTER — Other Ambulatory Visit: Payer: Self-pay | Admitting: *Deleted

## 2012-09-13 MED ORDER — TRIAMTERENE-HCTZ 37.5-25 MG PO TABS: 1.0000 | ORAL_TABLET | Freq: Every day | ORAL | Status: DC

## 2012-10-19 ENCOUNTER — Encounter: Payer: Self-pay | Admitting: Internal Medicine

## 2012-10-19 ENCOUNTER — Encounter (INDEPENDENT_AMBULATORY_CARE_PROVIDER_SITE_OTHER): Payer: Medicare Other | Admitting: *Deleted

## 2012-10-19 ENCOUNTER — Other Ambulatory Visit (INDEPENDENT_AMBULATORY_CARE_PROVIDER_SITE_OTHER): Payer: Medicare Other | Admitting: *Deleted

## 2012-10-19 DIAGNOSIS — R42 Dizziness and giddiness: Secondary | ICD-10-CM | POA: Diagnosis not present

## 2012-10-19 DIAGNOSIS — I10 Essential (primary) hypertension: Secondary | ICD-10-CM

## 2012-10-19 DIAGNOSIS — R5383 Other fatigue: Secondary | ICD-10-CM | POA: Diagnosis not present

## 2012-10-19 DIAGNOSIS — R5381 Other malaise: Secondary | ICD-10-CM | POA: Diagnosis not present

## 2012-10-19 DIAGNOSIS — Z8249 Family history of ischemic heart disease and other diseases of the circulatory system: Secondary | ICD-10-CM | POA: Diagnosis not present

## 2012-10-19 DIAGNOSIS — R109 Unspecified abdominal pain: Secondary | ICD-10-CM

## 2012-11-03 DIAGNOSIS — H26499 Other secondary cataract, unspecified eye: Secondary | ICD-10-CM | POA: Diagnosis not present

## 2012-11-03 DIAGNOSIS — Z961 Presence of intraocular lens: Secondary | ICD-10-CM | POA: Diagnosis not present

## 2012-11-20 ENCOUNTER — Other Ambulatory Visit (HOSPITAL_COMMUNITY): Payer: Self-pay | Admitting: Internal Medicine

## 2012-11-20 DIAGNOSIS — Z1231 Encounter for screening mammogram for malignant neoplasm of breast: Secondary | ICD-10-CM

## 2012-11-29 ENCOUNTER — Ambulatory Visit (HOSPITAL_COMMUNITY)
Admission: RE | Admit: 2012-11-29 | Discharge: 2012-11-29 | Disposition: A | Discharge status: Home / Self Care | Payer: Medicare Other | Source: Ambulatory Visit | Attending: Internal Medicine | Admitting: Internal Medicine

## 2012-11-29 DIAGNOSIS — Z1231 Encounter for screening mammogram for malignant neoplasm of breast: Secondary | ICD-10-CM | POA: Insufficient documentation

## 2012-12-15 DIAGNOSIS — H18419 Arcus senilis, unspecified eye: Secondary | ICD-10-CM | POA: Diagnosis not present

## 2012-12-15 DIAGNOSIS — H4011X0 Primary open-angle glaucoma, stage unspecified: Secondary | ICD-10-CM | POA: Diagnosis not present

## 2012-12-15 DIAGNOSIS — H11159 Pinguecula, unspecified eye: Secondary | ICD-10-CM | POA: Diagnosis not present

## 2012-12-15 DIAGNOSIS — H52229 Regular astigmatism, unspecified eye: Secondary | ICD-10-CM | POA: Diagnosis not present

## 2013-01-02 DIAGNOSIS — E785 Hyperlipidemia, unspecified: Secondary | ICD-10-CM | POA: Diagnosis not present

## 2013-01-02 DIAGNOSIS — Z79899 Other long term (current) drug therapy: Secondary | ICD-10-CM | POA: Diagnosis not present

## 2013-01-14 DIAGNOSIS — Z23 Encounter for immunization: Secondary | ICD-10-CM | POA: Diagnosis not present

## 2013-01-14 DIAGNOSIS — L03019 Cellulitis of unspecified finger: Secondary | ICD-10-CM | POA: Diagnosis not present

## 2013-02-01 DIAGNOSIS — D239 Other benign neoplasm of skin, unspecified: Secondary | ICD-10-CM | POA: Diagnosis not present

## 2013-02-01 DIAGNOSIS — L570 Actinic keratosis: Secondary | ICD-10-CM | POA: Diagnosis not present

## 2013-02-01 DIAGNOSIS — L03019 Cellulitis of unspecified finger: Secondary | ICD-10-CM | POA: Diagnosis not present

## 2013-02-01 DIAGNOSIS — L821 Other seborrheic keratosis: Secondary | ICD-10-CM | POA: Diagnosis not present

## 2013-02-01 DIAGNOSIS — L538 Other specified erythematous conditions: Secondary | ICD-10-CM | POA: Diagnosis not present

## 2013-03-05 DIAGNOSIS — H40019 Open angle with borderline findings, low risk, unspecified eye: Secondary | ICD-10-CM | POA: Diagnosis not present

## 2013-03-05 DIAGNOSIS — H35419 Lattice degeneration of retina, unspecified eye: Secondary | ICD-10-CM | POA: Diagnosis not present

## 2013-03-05 DIAGNOSIS — H353190 Nonexudative age-related macular degeneration, unspecified eye, stage unspecified: Secondary | ICD-10-CM | POA: Diagnosis not present

## 2013-03-12 DIAGNOSIS — R82998 Other abnormal findings in urine: Secondary | ICD-10-CM | POA: Diagnosis not present

## 2013-03-12 DIAGNOSIS — R55 Syncope and collapse: Secondary | ICD-10-CM | POA: Diagnosis not present

## 2013-03-12 DIAGNOSIS — R404 Transient alteration of awareness: Secondary | ICD-10-CM | POA: Diagnosis not present

## 2013-03-12 DIAGNOSIS — R0602 Shortness of breath: Secondary | ICD-10-CM | POA: Diagnosis not present

## 2013-03-21 ENCOUNTER — Other Ambulatory Visit (HOSPITAL_COMMUNITY): Payer: Self-pay | Admitting: Internal Medicine

## 2013-03-21 ENCOUNTER — Encounter: Payer: Self-pay | Admitting: Cardiology

## 2013-03-21 ENCOUNTER — Ambulatory Visit (HOSPITAL_COMMUNITY): Payer: Medicare Other | Attending: Cardiology | Admitting: Radiology

## 2013-03-21 DIAGNOSIS — R55 Syncope and collapse: Secondary | ICD-10-CM | POA: Diagnosis not present

## 2013-03-21 DIAGNOSIS — E785 Hyperlipidemia, unspecified: Secondary | ICD-10-CM | POA: Insufficient documentation

## 2013-03-21 DIAGNOSIS — I10 Essential (primary) hypertension: Secondary | ICD-10-CM | POA: Diagnosis not present

## 2013-03-21 DIAGNOSIS — I359 Nonrheumatic aortic valve disorder, unspecified: Secondary | ICD-10-CM | POA: Insufficient documentation

## 2013-03-21 NOTE — Progress Notes (Signed)
Echocardiogram performed.  

## 2013-03-30 DIAGNOSIS — H18419 Arcus senilis, unspecified eye: Secondary | ICD-10-CM | POA: Diagnosis not present

## 2013-03-30 DIAGNOSIS — H353190 Nonexudative age-related macular degeneration, unspecified eye, stage unspecified: Secondary | ICD-10-CM | POA: Diagnosis not present

## 2013-03-30 DIAGNOSIS — H4011X0 Primary open-angle glaucoma, stage unspecified: Secondary | ICD-10-CM | POA: Diagnosis not present

## 2013-03-30 DIAGNOSIS — H11159 Pinguecula, unspecified eye: Secondary | ICD-10-CM | POA: Diagnosis not present

## 2013-05-03 DIAGNOSIS — I10 Essential (primary) hypertension: Secondary | ICD-10-CM | POA: Diagnosis not present

## 2013-07-05 DIAGNOSIS — H4011X0 Primary open-angle glaucoma, stage unspecified: Secondary | ICD-10-CM | POA: Diagnosis not present

## 2013-07-05 DIAGNOSIS — H353190 Nonexudative age-related macular degeneration, unspecified eye, stage unspecified: Secondary | ICD-10-CM | POA: Diagnosis not present

## 2013-07-05 DIAGNOSIS — H4720 Unspecified optic atrophy: Secondary | ICD-10-CM | POA: Diagnosis not present

## 2013-07-05 DIAGNOSIS — H47239 Glaucomatous optic atrophy, unspecified eye: Secondary | ICD-10-CM | POA: Diagnosis not present

## 2013-08-27 DIAGNOSIS — H4720 Unspecified optic atrophy: Secondary | ICD-10-CM | POA: Diagnosis not present

## 2013-08-27 DIAGNOSIS — H40019 Open angle with borderline findings, low risk, unspecified eye: Secondary | ICD-10-CM | POA: Diagnosis not present

## 2013-08-27 DIAGNOSIS — H16109 Unspecified superficial keratitis, unspecified eye: Secondary | ICD-10-CM | POA: Diagnosis not present

## 2013-09-07 DIAGNOSIS — M949 Disorder of cartilage, unspecified: Secondary | ICD-10-CM | POA: Diagnosis not present

## 2013-09-07 DIAGNOSIS — M899 Disorder of bone, unspecified: Secondary | ICD-10-CM | POA: Diagnosis not present

## 2013-09-07 DIAGNOSIS — E785 Hyperlipidemia, unspecified: Secondary | ICD-10-CM | POA: Diagnosis not present

## 2013-09-07 DIAGNOSIS — I10 Essential (primary) hypertension: Secondary | ICD-10-CM | POA: Diagnosis not present

## 2013-09-14 DIAGNOSIS — E785 Hyperlipidemia, unspecified: Secondary | ICD-10-CM | POA: Diagnosis not present

## 2013-09-14 DIAGNOSIS — M722 Plantar fascial fibromatosis: Secondary | ICD-10-CM | POA: Diagnosis not present

## 2013-09-14 DIAGNOSIS — R55 Syncope and collapse: Secondary | ICD-10-CM | POA: Diagnosis not present

## 2013-09-14 DIAGNOSIS — I10 Essential (primary) hypertension: Secondary | ICD-10-CM | POA: Diagnosis not present

## 2013-09-14 DIAGNOSIS — M949 Disorder of cartilage, unspecified: Secondary | ICD-10-CM | POA: Diagnosis not present

## 2013-09-14 DIAGNOSIS — Z1331 Encounter for screening for depression: Secondary | ICD-10-CM | POA: Diagnosis not present

## 2013-09-14 DIAGNOSIS — J309 Allergic rhinitis, unspecified: Secondary | ICD-10-CM | POA: Diagnosis not present

## 2013-09-14 DIAGNOSIS — Z0000 Encounter for general adult medical examination without abnormal findings: Secondary | ICD-10-CM | POA: Diagnosis not present

## 2013-09-14 DIAGNOSIS — M899 Disorder of bone, unspecified: Secondary | ICD-10-CM | POA: Diagnosis not present

## 2013-09-19 DIAGNOSIS — Z1212 Encounter for screening for malignant neoplasm of rectum: Secondary | ICD-10-CM | POA: Diagnosis not present

## 2013-11-26 DIAGNOSIS — H04129 Dry eye syndrome of unspecified lacrimal gland: Secondary | ICD-10-CM | POA: Diagnosis not present

## 2013-11-26 DIAGNOSIS — H4720 Unspecified optic atrophy: Secondary | ICD-10-CM | POA: Diagnosis not present

## 2013-11-26 DIAGNOSIS — H40019 Open angle with borderline findings, low risk, unspecified eye: Secondary | ICD-10-CM | POA: Diagnosis not present

## 2013-11-26 DIAGNOSIS — H16109 Unspecified superficial keratitis, unspecified eye: Secondary | ICD-10-CM | POA: Diagnosis not present

## 2014-01-02 DIAGNOSIS — M899 Disorder of bone, unspecified: Secondary | ICD-10-CM | POA: Diagnosis not present

## 2014-01-02 DIAGNOSIS — M949 Disorder of cartilage, unspecified: Secondary | ICD-10-CM | POA: Diagnosis not present

## 2014-01-25 ENCOUNTER — Encounter (HOSPITAL_COMMUNITY): Payer: Self-pay | Admitting: Emergency Medicine

## 2014-01-25 ENCOUNTER — Emergency Department (HOSPITAL_COMMUNITY)
Admission: EM | Admit: 2014-01-25 | Discharge: 2014-01-25 | Disposition: A | Discharge status: Home / Self Care | Payer: Medicare Other | Attending: Emergency Medicine | Admitting: Emergency Medicine

## 2014-01-25 DIAGNOSIS — R400 Somnolence: Secondary | ICD-10-CM | POA: Diagnosis present

## 2014-01-25 DIAGNOSIS — I10 Essential (primary) hypertension: Secondary | ICD-10-CM | POA: Diagnosis not present

## 2014-01-25 DIAGNOSIS — H548 Legal blindness, as defined in USA: Secondary | ICD-10-CM | POA: Diagnosis not present

## 2014-01-25 DIAGNOSIS — Z8639 Personal history of other endocrine, nutritional and metabolic disease: Secondary | ICD-10-CM | POA: Diagnosis not present

## 2014-01-25 DIAGNOSIS — R55 Syncope and collapse: Secondary | ICD-10-CM | POA: Diagnosis not present

## 2014-01-25 DIAGNOSIS — W1839XA Other fall on same level, initial encounter: Secondary | ICD-10-CM | POA: Diagnosis not present

## 2014-01-25 DIAGNOSIS — Z79899 Other long term (current) drug therapy: Secondary | ICD-10-CM | POA: Diagnosis not present

## 2014-01-25 DIAGNOSIS — S3992XA Unspecified injury of lower back, initial encounter: Secondary | ICD-10-CM | POA: Diagnosis not present

## 2014-01-25 DIAGNOSIS — S59901A Unspecified injury of right elbow, initial encounter: Secondary | ICD-10-CM | POA: Diagnosis not present

## 2014-01-25 DIAGNOSIS — Y92008 Other place in unspecified non-institutional (private) residence as the place of occurrence of the external cause: Secondary | ICD-10-CM | POA: Diagnosis not present

## 2014-01-25 HISTORY — DX: Serous retinal detachment, unspecified eye: H33.20

## 2014-01-25 LAB — BASIC METABOLIC PANEL
Anion gap: 14 (ref 5–15)
BUN: 11 mg/dL (ref 6–23)
CO2: 26 meq/L (ref 19–32)
Calcium: 10.2 mg/dL (ref 8.4–10.5)
Chloride: 91 mEq/L — ABNORMAL LOW (ref 96–112)
Creatinine, Ser: 0.76 mg/dL (ref 0.50–1.10)
GFR calc Af Amer: >90 mL/min (ref >90)
GFR calc non Af Amer: 79 mL/min — ABNORMAL LOW (ref >90)
GLUCOSE: 102 mg/dL — AB (ref 70–99)
POTASSIUM: 3.6 meq/L — AB (ref 3.7–5.3)
SODIUM: 131 meq/L — AB (ref 137–147)

## 2014-01-25 LAB — CBC WITH DIFFERENTIAL/PLATELET
Basophils Absolute: 0 10*3/uL (ref 0.0–0.1)
Basophils Relative: 0 % (ref 0–1)
Eosinophils Absolute: 0 10*3/uL (ref 0.0–0.7)
Eosinophils Relative: 0 % (ref 0–5)
HCT: 42.5 % (ref 36.0–46.0)
Hemoglobin: 14.6 g/dL (ref 12.0–15.0)
LYMPHS ABS: 1.3 10*3/uL (ref 0.7–4.0)
Lymphocytes Relative: 11 % — ABNORMAL LOW (ref 12–46)
MCH: 30.4 pg (ref 26.0–34.0)
MCHC: 34.4 g/dL (ref 30.0–36.0)
MCV: 88.4 fL (ref 78.0–100.0)
Monocytes Absolute: 1.1 10*3/uL — ABNORMAL HIGH (ref 0.1–1.0)
Monocytes Relative: 9 % (ref 3–12)
NEUTROS PCT: 80 % — AB (ref 43–77)
Neutro Abs: 9.3 10*3/uL — ABNORMAL HIGH (ref 1.7–7.7)
PLATELETS: 269 10*3/uL (ref 150–400)
RBC: 4.81 MIL/uL (ref 3.87–5.11)
RDW: 13.1 % (ref 11.5–15.5)
WBC: 11.7 10*3/uL — ABNORMAL HIGH (ref 4.0–10.5)

## 2014-01-25 LAB — TROPONIN I

## 2014-01-25 LAB — URINALYSIS, ROUTINE W REFLEX MICROSCOPIC
Bilirubin Urine: NEGATIVE
Glucose, UA: NEGATIVE mg/dL
Hgb urine dipstick: NEGATIVE
Ketones, ur: NEGATIVE mg/dL
Leukocytes, UA: NEGATIVE
NITRITE: NEGATIVE
PH: 8 (ref 5.0–8.0)
Protein, ur: NEGATIVE mg/dL
SPECIFIC GRAVITY, URINE: 1.007 (ref 1.005–1.030)
Urobilinogen, UA: 0.2 mg/dL (ref 0.0–1.0)

## 2014-01-25 MED ORDER — SODIUM CHLORIDE 0.9 % IV SOLN
INTRAVENOUS | Status: DC
  Administered 2014-01-25: 11:00:00 via INTRAVENOUS

## 2014-01-25 NOTE — ED Notes (Signed)
Initial Contact - pt A+Ox4, resting on stretcher, reports syncopal episode x3 last night.  Denies complaints prior to events "i just woke up on the floor", and continues to deny complaints at this time.  Neuros grossly intact.  Speaking full/clear sentences, rr even/un-lab.  Denies CP/palpitations, SOB.  SR on monitor, no ectopy.  Pt aware of need for urine spec, sts unable to provide at this time.  NAD.

## 2014-01-25 NOTE — ED Notes (Signed)
Pt from home reports that she had 3 syncopal episodes last pm. Pt reports that she was in kitchen, woke up on floor, went to dining room and woke up on floor, went back to kitchen and woke up again on kitchen floor. Pt reports that she was nauseous and diaphoretic last pm, but has no s/sx today other than anxiety. Pt reports soreness to back from falling, but denies injury. Pt is A&O and in NAD

## 2014-01-25 NOTE — ED Provider Notes (Signed)
CSN: [id number]     Arrival date & time 01/25/14  1011 History   First MD Initiated Contact with Patient 01/25/14 1028     Chief Complaint  Patient presents with  . Loss of Consciousness     (Consider location/radiation/quality/duration/timing/severity/associated sxs/prior Treatment) HPI  Christine Davila is a 79 y.o. female presents for evaluation of syncope, x3. These episodes occurred last night. One time, she had a short prodrome of dizziness. No prodrome, the other 2 times. She has mild, left lower back, and right elbow pain since the syncopal episodes. She ended up on the floor, each time. After the third syncope, she has been walking, and asymptomatic. She slept well last night. She presents for evaluation, by private vehicle. She ate some breakfast this morning without problems. She denies recent fever, chills, nausea, vomiting, other episodes of weakness, or near syncope, chest pain or shortness of breath. She is taking her meds as prescribed.There are no other known modifying factors.  Past Medical History  Diagnosis Date  . Hypercholesterolemia   . Essential hypertension   . Hyperlipidemia   . Detached retina right    legally blind   Past Surgical History  Procedure Laterality Date  . Tumor of ovary      removal of benign   No family history on file. History  Substance Use Topics  . Smoking status: Never Smoker   . Smokeless tobacco: Not on file  . Alcohol Use: No   OB History   Grav Para Term Preterm Abortions TAB SAB Ect Mult Living                 Review of Systems  All other systems reviewed and are negative.     Allergies  Ace inhibitors and Lipitor  Home Medications   Prior to Admission medications   Medication Sig Start Date End Date Taking? Authorizing Provider  amLODipine (NORVASC) 5 MG tablet Take 2.5 mg by mouth daily.   Yes Historical Provider, MD  b complex vitamins tablet Take 1 tablet by mouth daily. Taking super b complex daily    Yes  Historical Provider, MD  Cholecalciferol (VITAMIN D PO) Take 1,000 mg by mouth daily.    Yes Historical Provider, MD  Coenzyme Q10 (COQ-10 PO) Take 1 tablet by mouth at bedtime.    Yes Historical Provider, MD  cycloSPORINE (RESTASIS) 0.05 % ophthalmic emulsion Place 1 drop into both eyes 2 (two) times daily.   Yes Historical Provider, MD  dorzolamide-timolol (COSOPT) 22.3-6.8 MG/ML ophthalmic solution Place 1 drop into both eyes 2 (two) times daily.    Yes Historical Provider, MD  fish oil-omega-3 fatty acids 1000 MG capsule Take 1 g by mouth daily.    Yes Historical Provider, MD  Flaxseed, Linseed, (FLAX SEED OIL PO) Take 1 tablet by mouth daily.    Yes Historical Provider, MD  GARLIC PO Take 1 tablet by mouth daily.    Yes Historical Provider, MD  GLUCOSAMINE PO Take 750 mg by mouth daily.     Yes Historical Provider, MD  hydrochlorothiazide (HYDRODIURIL) 25 MG tablet Take 25 mg by mouth daily.   Yes Historical Provider, MD  losartan (COZAAR) 100 MG tablet Take 1 tablet (100 mg total) by mouth daily. 09/06/12  Yes Darlin Coco, MD  Multiple Vitamin (MULTIVITAMIN) tablet Take 1 tablet by mouth daily.     Yes Historical Provider, MD  Multiple Vitamins-Minerals (EYE VITAMINS) CAPS Take 1 capsule by mouth daily.    Yes Historical Provider,  MD  Travoprost, BAK Free, (TRAVATAMN) 0.004 % SOLN ophthalmic solution Place 1 drop into both eyes at bedtime.    Yes Historical Provider, MD   BP 145/58  Pulse 67  Temp(Src) 97.6 F (36.4 C) (Oral)  Resp 20  SpO2 100% Physical Exam  Nursing note and vitals reviewed. Constitutional: She is oriented to person, place, and time. She appears well-developed and well-nourished.  HENT:  Head: Normocephalic and atraumatic.  Eyes: Conjunctivae and EOM are normal. Pupils are equal, round, and reactive to light.  Neck: Normal range of motion and phonation normal. Neck supple.  Cardiovascular: Normal rate and regular rhythm.   Pulmonary/Chest: Effort normal and  breath sounds normal. She exhibits no tenderness.  Abdominal: Soft. She exhibits no distension. There is no tenderness. There is no guarding.  Musculoskeletal: Normal range of motion. She exhibits no edema and no tenderness.  Normal gait, without antalgia  Neurological: She is alert and oriented to person, place, and time. She exhibits normal muscle tone.  No dysarthria, aphasia or nystagmus  Skin: Skin is warm and dry.  Psychiatric: She has a normal mood and affect. Her behavior is normal. Judgment and thought content normal.    ED Course  Procedures (including critical care time)  Medications  0.9 %  sodium chloride infusion ( Intravenous New Bag/Given 01/25/14 1055)    Patient Vitals for the past 24 hrs:  BP Temp Temp src Pulse Resp SpO2  01/25/14 1130 145/58 mmHg - - - - -  01/25/14 1117 160/86 mmHg - - 67 - 100 %  01/25/14 1054 143/78 mmHg - - 64 20 98 %  01/25/14 1052 147/63 mmHg - - 68 20 98 %  01/25/14 1050 159/61 mmHg - - - 15 99 %  01/25/14 1018 161/81 mmHg 97.6 F (36.4 C) Oral 69 16 98 %    1:36 PM Reevaluation with update and discussion. After initial assessment and treatment, an updated evaluation reveals no further complaints, she is alert and lucid. Findings discussed with patient, and son, all questions answered. She's been tolerating oral liquids, in the emergency department. Timmie Dugue L   13:40- case discussed with PCP, Dr. Osborne Casco (on-call), he will arrange office followup, next week.   Labs Review Labs Reviewed  CBC WITH DIFFERENTIAL - Abnormal; Notable for the following:    WBC 11.7 (*)    Neutrophils Relative % 80 (*)    Neutro Abs 9.3 (*)    Lymphocytes Relative 11 (*)    Monocytes Absolute 1.1 (*)    All other components within normal limits  BASIC METABOLIC PANEL - Abnormal; Notable for the following:    Sodium 131 (*)    Potassium 3.6 (*)    Chloride 91 (*)    Glucose, Bld 102 (*)    GFR calc non Af Amer 79 (*)    All other components  within normal limits  URINALYSIS, ROUTINE W REFLEX MICROSCOPIC - Abnormal; Notable for the following:    APPearance CLOUDY (*)    All other components within normal limits  URINE CULTURE  TROPONIN I    Imaging Review No results found.   EKG Interpretation   Date/Time:  Friday January 25 2014 10:19:58 EDT Ventricular Rate:  68 PR Interval:  191 QRS Duration: 68 QT Interval:  386 QTC Calculation: 410 R Axis:   24 Text Interpretation:  Sinus rhythm Probable left atrial enlargement  Probable anteroseptal infarct, old Borderline ST depression, lateral leads  Baseline wander in lead(s) V6 No old tracing  to compare Confirmed by [facility]   MD, [place] [phone]) on 01/25/2014 10:29:20 AM      MDM   Final diagnoses:  Syncope, unspecified syncope type    Nursing Notes Reviewed/ Care Coordinated Applicable Imaging Reviewed Interpretation of Laboratory Data incorporated into ED treatment  The patient appears reasonably screened and/or stabilized for discharge and I doubt any other medical condition or other [facility] requiring further screening, evaluation, or treatment in the ED at this time prior to discharge.  Plan: Home Medications- usual; Home Treatments- increase oral intake; return here if the recommended treatment, does not improve the symptoms; Recommended follow up- PCP f/u 3-5 days     Richarda Blade, MD 01/25/14 1347

## 2014-01-25 NOTE — ED Notes (Signed)
Pt ambulatory with steady gait to void in BR.  

## 2014-01-25 NOTE — Discharge Instructions (Signed)
Eat 3 meals, each day, and try to get at least 1200-1500 calories in, each day. Drink plenty of fluids. Your doctor's office, will call you on Monday to schedule a followup appointment    Syncope Syncope is a medical term for fainting or passing out. This means you lose consciousness and drop to the ground. People are generally unconscious for less than 5 minutes. You may have some muscle twitches for up to 15 seconds before waking up and returning to normal. Syncope occurs more often in older adults, but it can happen to anyone. While most causes of syncope are not dangerous, syncope can be a sign of a serious medical problem. It is important to seek medical care.  CAUSES  Syncope is caused by a sudden drop in blood flow to the brain. The specific cause is often not determined. Factors that can bring on syncope include:  Taking medicines that lower blood pressure.  Sudden changes in posture, such as standing up quickly.  Taking more medicine than prescribed.  Standing in one place for too long.  Seizure disorders.  Dehydration and excessive exposure to heat.  Low blood sugar (hypoglycemia).  Straining to have a bowel movement.  Heart disease, irregular heartbeat, or other circulatory problems.  Fear, emotional distress, seeing blood, or severe pain. SYMPTOMS  Right before fainting, you may:  Feel dizzy or light-headed.  Feel nauseous.  See all white or all black in your field of vision.  Have cold, clammy skin. DIAGNOSIS  Your health care provider will ask about your symptoms, perform a physical exam, and perform an electrocardiogram (ECG) to record the electrical activity of your heart. Your health care provider may also perform other heart or blood tests to determine the cause of your syncope which may include:  Transthoracic echocardiogram (TTE). During echocardiography, sound waves are used to evaluate how blood flows through your heart.  Transesophageal  echocardiogram (TEE).  Cardiac monitoring. This allows your health care provider to monitor your heart rate and rhythm in real time.  Holter monitor. This is a portable device that records your heartbeat and can help diagnose heart arrhythmias. It allows your health care provider to track your heart activity for several days, if needed.  Stress tests by exercise or by giving medicine that makes the heart beat faster. TREATMENT  In most cases, no treatment is needed. Depending on the cause of your syncope, your health care provider may recommend changing or stopping some of your medicines. HOME CARE INSTRUCTIONS  Have someone stay with you until you feel stable.  Do not drive, use machinery, or play sports until your health care provider says it is okay.  Keep all follow-up appointments as directed by your health care provider.  Lie down right away if you start feeling like you might faint. Breathe deeply and steadily. Wait until all the symptoms have passed.  Drink enough fluids to keep your urine clear or pale yellow.  If you are taking blood pressure or heart medicine, get up slowly and take several minutes to sit and then stand. This can reduce dizziness. SEEK IMMEDIATE MEDICAL CARE IF:   You have a severe headache.  You have unusual pain in the chest, abdomen, or back.  You are bleeding from your mouth or rectum, or you have black or tarry stool.  You have an irregular or very fast heartbeat.  You have pain with breathing.  You have repeated fainting or seizure-like jerking during an episode.  You faint when sitting  or lying down.  You have confusion.  You have trouble walking.  You have severe weakness.  You have vision problems. If you fainted, call your local emergency services (911 in [place] Do not drive yourself to the hospital.  MAKE SURE YOU:  Understand these instructions.  Will watch your condition.  Will get help right away if you are not doing well  or get worse. Document Released: 04/05/2005 Document Revised: 04/10/2013 Document Reviewed: 06/04/2011 [facility] Patient Information 2015 [place], [place]. This information is not intended to replace advice given to you by your health care provider. Make sure you discuss any questions you have with your health care provider.

## 2014-01-26 LAB — URINE CULTURE
COLONY COUNT: NO GROWTH
CULTURE: NO GROWTH

## 2014-01-28 DIAGNOSIS — R112 Nausea with vomiting, unspecified: Secondary | ICD-10-CM | POA: Diagnosis not present

## 2014-01-28 DIAGNOSIS — Z6821 Body mass index (BMI) 21.0-21.9, adult: Secondary | ICD-10-CM | POA: Diagnosis not present

## 2014-01-28 DIAGNOSIS — R55 Syncope and collapse: Secondary | ICD-10-CM | POA: Diagnosis not present

## 2014-01-31 DIAGNOSIS — Z961 Presence of intraocular lens: Secondary | ICD-10-CM | POA: Diagnosis not present

## 2014-01-31 DIAGNOSIS — H4011X1 Primary open-angle glaucoma, mild stage: Secondary | ICD-10-CM | POA: Diagnosis not present

## 2014-01-31 DIAGNOSIS — H18413 Arcus senilis, bilateral: Secondary | ICD-10-CM | POA: Diagnosis not present

## 2014-01-31 DIAGNOSIS — S0501XA Injury of conjunctiva and corneal abrasion without foreign body, right eye, initial encounter: Secondary | ICD-10-CM | POA: Diagnosis not present

## 2014-01-31 DIAGNOSIS — H11153 Pinguecula, bilateral: Secondary | ICD-10-CM | POA: Diagnosis not present

## 2014-01-31 DIAGNOSIS — H04123 Dry eye syndrome of bilateral lacrimal glands: Secondary | ICD-10-CM | POA: Diagnosis not present

## 2014-01-31 DIAGNOSIS — H3530 Unspecified macular degeneration: Secondary | ICD-10-CM | POA: Diagnosis not present

## 2014-01-31 DIAGNOSIS — H3531 Nonexudative age-related macular degeneration: Secondary | ICD-10-CM | POA: Diagnosis not present

## 2014-02-01 ENCOUNTER — Other Ambulatory Visit: Payer: Self-pay

## 2014-02-10 DIAGNOSIS — Z23 Encounter for immunization: Secondary | ICD-10-CM | POA: Diagnosis not present

## 2014-03-25 DIAGNOSIS — I10 Essential (primary) hypertension: Secondary | ICD-10-CM | POA: Diagnosis not present

## 2014-03-25 DIAGNOSIS — M8580 Other specified disorders of bone density and structure, unspecified site: Secondary | ICD-10-CM | POA: Diagnosis not present

## 2014-03-25 DIAGNOSIS — E785 Hyperlipidemia, unspecified: Secondary | ICD-10-CM | POA: Diagnosis not present

## 2014-03-25 DIAGNOSIS — Z6821 Body mass index (BMI) 21.0-21.9, adult: Secondary | ICD-10-CM | POA: Diagnosis not present

## 2014-05-06 DIAGNOSIS — H3531 Nonexudative age-related macular degeneration: Secondary | ICD-10-CM | POA: Diagnosis not present

## 2014-05-06 DIAGNOSIS — H35413 Lattice degeneration of retina, bilateral: Secondary | ICD-10-CM | POA: Diagnosis not present

## 2014-05-06 DIAGNOSIS — H40012 Open angle with borderline findings, low risk, left eye: Secondary | ICD-10-CM | POA: Diagnosis not present

## 2014-05-15 ENCOUNTER — Other Ambulatory Visit (HOSPITAL_COMMUNITY): Payer: Self-pay | Admitting: Internal Medicine

## 2014-05-15 DIAGNOSIS — Z1231 Encounter for screening mammogram for malignant neoplasm of breast: Secondary | ICD-10-CM

## 2014-06-06 ENCOUNTER — Ambulatory Visit (HOSPITAL_COMMUNITY)
Admission: RE | Admit: 2014-06-06 | Discharge: 2014-06-06 | Disposition: A | Discharge status: Home / Self Care | Payer: Medicare Other | Source: Ambulatory Visit | Attending: Internal Medicine | Admitting: Internal Medicine

## 2014-06-06 ENCOUNTER — Other Ambulatory Visit (HOSPITAL_COMMUNITY): Payer: Self-pay | Admitting: Internal Medicine

## 2014-06-06 DIAGNOSIS — Z1231 Encounter for screening mammogram for malignant neoplasm of breast: Secondary | ICD-10-CM | POA: Diagnosis not present

## 2014-07-25 DIAGNOSIS — D485 Neoplasm of uncertain behavior of skin: Secondary | ICD-10-CM | POA: Diagnosis not present

## 2014-07-25 DIAGNOSIS — D225 Melanocytic nevi of trunk: Secondary | ICD-10-CM | POA: Diagnosis not present

## 2014-07-25 DIAGNOSIS — L821 Other seborrheic keratosis: Secondary | ICD-10-CM | POA: Diagnosis not present

## 2014-07-25 DIAGNOSIS — L820 Inflamed seborrheic keratosis: Secondary | ICD-10-CM | POA: Diagnosis not present

## 2014-07-25 DIAGNOSIS — Z86018 Personal history of other benign neoplasm: Secondary | ICD-10-CM | POA: Diagnosis not present

## 2014-07-26 DIAGNOSIS — J018 Other acute sinusitis: Secondary | ICD-10-CM | POA: Diagnosis not present

## 2014-07-26 DIAGNOSIS — J029 Acute pharyngitis, unspecified: Secondary | ICD-10-CM | POA: Diagnosis not present

## 2014-08-15 DIAGNOSIS — H04123 Dry eye syndrome of bilateral lacrimal glands: Secondary | ICD-10-CM | POA: Diagnosis not present

## 2014-08-15 DIAGNOSIS — S0501XA Injury of conjunctiva and corneal abrasion without foreign body, right eye, initial encounter: Secondary | ICD-10-CM | POA: Diagnosis not present

## 2014-08-15 DIAGNOSIS — H4011X2 Primary open-angle glaucoma, moderate stage: Secondary | ICD-10-CM | POA: Diagnosis not present

## 2014-08-15 DIAGNOSIS — H18413 Arcus senilis, bilateral: Secondary | ICD-10-CM | POA: Diagnosis not present

## 2014-08-15 DIAGNOSIS — Z9849 Cataract extraction status, unspecified eye: Secondary | ICD-10-CM | POA: Diagnosis not present

## 2014-08-15 DIAGNOSIS — H5202 Hypermetropia, left eye: Secondary | ICD-10-CM | POA: Diagnosis not present

## 2014-08-15 DIAGNOSIS — H11153 Pinguecula, bilateral: Secondary | ICD-10-CM | POA: Diagnosis not present

## 2014-08-15 DIAGNOSIS — H52222 Regular astigmatism, left eye: Secondary | ICD-10-CM | POA: Diagnosis not present

## 2014-09-30 DIAGNOSIS — M859 Disorder of bone density and structure, unspecified: Secondary | ICD-10-CM | POA: Diagnosis not present

## 2014-09-30 DIAGNOSIS — E785 Hyperlipidemia, unspecified: Secondary | ICD-10-CM | POA: Diagnosis not present

## 2014-09-30 DIAGNOSIS — I10 Essential (primary) hypertension: Secondary | ICD-10-CM | POA: Diagnosis not present

## 2014-10-02 DIAGNOSIS — Z1212 Encounter for screening for malignant neoplasm of rectum: Secondary | ICD-10-CM | POA: Diagnosis not present

## 2014-10-07 DIAGNOSIS — Z1389 Encounter for screening for other disorder: Secondary | ICD-10-CM | POA: Diagnosis not present

## 2014-10-07 DIAGNOSIS — Z0000 Encounter for general adult medical examination without abnormal findings: Secondary | ICD-10-CM | POA: Diagnosis not present

## 2014-10-07 DIAGNOSIS — M722 Plantar fascial fibromatosis: Secondary | ICD-10-CM | POA: Diagnosis not present

## 2014-10-07 DIAGNOSIS — Z1231 Encounter for screening mammogram for malignant neoplasm of breast: Secondary | ICD-10-CM | POA: Diagnosis not present

## 2014-10-07 DIAGNOSIS — I10 Essential (primary) hypertension: Secondary | ICD-10-CM | POA: Diagnosis not present

## 2014-10-07 DIAGNOSIS — E785 Hyperlipidemia, unspecified: Secondary | ICD-10-CM | POA: Diagnosis not present

## 2014-10-07 DIAGNOSIS — R55 Syncope and collapse: Secondary | ICD-10-CM | POA: Diagnosis not present

## 2014-10-07 DIAGNOSIS — Z6821 Body mass index (BMI) 21.0-21.9, adult: Secondary | ICD-10-CM | POA: Diagnosis not present

## 2014-10-07 DIAGNOSIS — J309 Allergic rhinitis, unspecified: Secondary | ICD-10-CM | POA: Diagnosis not present

## 2014-10-07 DIAGNOSIS — M859 Disorder of bone density and structure, unspecified: Secondary | ICD-10-CM | POA: Diagnosis not present

## 2014-12-12 DIAGNOSIS — H04129 Dry eye syndrome of unspecified lacrimal gland: Secondary | ICD-10-CM | POA: Diagnosis not present

## 2014-12-12 DIAGNOSIS — H35413 Lattice degeneration of retina, bilateral: Secondary | ICD-10-CM | POA: Diagnosis not present

## 2014-12-12 DIAGNOSIS — H40012 Open angle with borderline findings, low risk, left eye: Secondary | ICD-10-CM | POA: Diagnosis not present

## 2014-12-12 DIAGNOSIS — H3531 Nonexudative age-related macular degeneration: Secondary | ICD-10-CM | POA: Diagnosis not present

## 2015-01-25 DIAGNOSIS — Z23 Encounter for immunization: Secondary | ICD-10-CM | POA: Diagnosis not present

## 2015-03-05 DIAGNOSIS — L03119 Cellulitis of unspecified part of limb: Secondary | ICD-10-CM | POA: Diagnosis not present

## 2015-03-05 DIAGNOSIS — S90851A Superficial foreign body, right foot, initial encounter: Secondary | ICD-10-CM | POA: Diagnosis not present

## 2015-03-31 DIAGNOSIS — Z961 Presence of intraocular lens: Secondary | ICD-10-CM | POA: Diagnosis not present

## 2015-03-31 DIAGNOSIS — H401132 Primary open-angle glaucoma, bilateral, moderate stage: Secondary | ICD-10-CM | POA: Diagnosis not present

## 2015-03-31 DIAGNOSIS — Z9849 Cataract extraction status, unspecified eye: Secondary | ICD-10-CM | POA: Diagnosis not present

## 2015-03-31 DIAGNOSIS — H35433 Paving stone degeneration of retina, bilateral: Secondary | ICD-10-CM | POA: Diagnosis not present

## 2015-03-31 DIAGNOSIS — H3540 Unspecified peripheral retinal degeneration: Secondary | ICD-10-CM | POA: Diagnosis not present

## 2015-03-31 DIAGNOSIS — H353131 Nonexudative age-related macular degeneration, bilateral, early dry stage: Secondary | ICD-10-CM | POA: Diagnosis not present

## 2015-03-31 DIAGNOSIS — H59811 Chorioretinal scars after surgery for detachment, right eye: Secondary | ICD-10-CM | POA: Diagnosis not present

## 2015-03-31 DIAGNOSIS — H11153 Pinguecula, bilateral: Secondary | ICD-10-CM | POA: Diagnosis not present

## 2015-03-31 DIAGNOSIS — H1851 Endothelial corneal dystrophy: Secondary | ICD-10-CM | POA: Diagnosis not present

## 2015-03-31 DIAGNOSIS — H11423 Conjunctival edema, bilateral: Secondary | ICD-10-CM | POA: Diagnosis not present

## 2015-03-31 DIAGNOSIS — H35413 Lattice degeneration of retina, bilateral: Secondary | ICD-10-CM | POA: Diagnosis not present

## 2015-03-31 DIAGNOSIS — H18413 Arcus senilis, bilateral: Secondary | ICD-10-CM | POA: Diagnosis not present

## 2015-07-14 DIAGNOSIS — H401122 Primary open-angle glaucoma, left eye, moderate stage: Secondary | ICD-10-CM | POA: Diagnosis not present

## 2015-07-14 DIAGNOSIS — H4720 Unspecified optic atrophy: Secondary | ICD-10-CM | POA: Diagnosis not present

## 2015-07-14 DIAGNOSIS — H35033 Hypertensive retinopathy, bilateral: Secondary | ICD-10-CM | POA: Diagnosis not present

## 2015-07-14 DIAGNOSIS — Z961 Presence of intraocular lens: Secondary | ICD-10-CM | POA: Diagnosis not present

## 2015-07-14 DIAGNOSIS — H11153 Pinguecula, bilateral: Secondary | ICD-10-CM | POA: Diagnosis not present

## 2015-07-14 DIAGNOSIS — Z9849 Cataract extraction status, unspecified eye: Secondary | ICD-10-CM | POA: Diagnosis not present

## 2015-07-14 DIAGNOSIS — H04123 Dry eye syndrome of bilateral lacrimal glands: Secondary | ICD-10-CM | POA: Diagnosis not present

## 2015-07-14 DIAGNOSIS — H401112 Primary open-angle glaucoma, right eye, moderate stage: Secondary | ICD-10-CM | POA: Diagnosis not present

## 2015-07-14 DIAGNOSIS — H35363 Drusen (degenerative) of macula, bilateral: Secondary | ICD-10-CM | POA: Diagnosis not present

## 2015-07-14 DIAGNOSIS — H35433 Paving stone degeneration of retina, bilateral: Secondary | ICD-10-CM | POA: Diagnosis not present

## 2015-07-14 DIAGNOSIS — H353131 Nonexudative age-related macular degeneration, bilateral, early dry stage: Secondary | ICD-10-CM | POA: Diagnosis not present

## 2015-07-14 DIAGNOSIS — H59811 Chorioretinal scars after surgery for detachment, right eye: Secondary | ICD-10-CM | POA: Diagnosis not present

## 2015-07-31 DIAGNOSIS — L821 Other seborrheic keratosis: Secondary | ICD-10-CM | POA: Diagnosis not present

## 2015-07-31 DIAGNOSIS — D225 Melanocytic nevi of trunk: Secondary | ICD-10-CM | POA: Diagnosis not present

## 2015-07-31 DIAGNOSIS — L304 Erythema intertrigo: Secondary | ICD-10-CM | POA: Diagnosis not present

## 2015-07-31 DIAGNOSIS — L570 Actinic keratosis: Secondary | ICD-10-CM | POA: Diagnosis not present

## 2015-07-31 DIAGNOSIS — Z86018 Personal history of other benign neoplasm: Secondary | ICD-10-CM | POA: Diagnosis not present

## 2015-08-25 ENCOUNTER — Emergency Department (HOSPITAL_COMMUNITY)
Admission: EM | Admit: 2015-08-25 | Discharge: 2015-08-25 | Disposition: A | Discharge status: Home / Self Care | Payer: Medicare Other | Attending: Emergency Medicine | Admitting: Emergency Medicine

## 2015-08-25 ENCOUNTER — Encounter (HOSPITAL_COMMUNITY): Payer: Self-pay | Admitting: Nurse Practitioner

## 2015-08-25 DIAGNOSIS — E785 Hyperlipidemia, unspecified: Secondary | ICD-10-CM | POA: Insufficient documentation

## 2015-08-25 DIAGNOSIS — I959 Hypotension, unspecified: Secondary | ICD-10-CM | POA: Diagnosis not present

## 2015-08-25 DIAGNOSIS — Z79899 Other long term (current) drug therapy: Secondary | ICD-10-CM | POA: Diagnosis not present

## 2015-08-25 DIAGNOSIS — H548 Legal blindness, as defined in USA: Secondary | ICD-10-CM | POA: Diagnosis not present

## 2015-08-25 DIAGNOSIS — I10 Essential (primary) hypertension: Secondary | ICD-10-CM | POA: Insufficient documentation

## 2015-08-25 DIAGNOSIS — R404 Transient alteration of awareness: Secondary | ICD-10-CM | POA: Diagnosis not present

## 2015-08-25 DIAGNOSIS — Z792 Long term (current) use of antibiotics: Secondary | ICD-10-CM | POA: Diagnosis not present

## 2015-08-25 DIAGNOSIS — E7800 Pure hypercholesterolemia, unspecified: Secondary | ICD-10-CM | POA: Diagnosis not present

## 2015-08-25 DIAGNOSIS — R55 Syncope and collapse: Secondary | ICD-10-CM | POA: Diagnosis not present

## 2015-08-25 LAB — COMPREHENSIVE METABOLIC PANEL
ALBUMIN: 3.6 g/dL (ref 3.5–5.0)
ALT: 21 U/L (ref 14–54)
ANION GAP: 10 (ref 5–15)
AST: 21 U/L (ref 15–41)
Alkaline Phosphatase: 90 U/L (ref 38–126)
BILIRUBIN TOTAL: 0.8 mg/dL (ref 0.3–1.2)
BUN: 13 mg/dL (ref 6–20)
CHLORIDE: 99 mmol/L — AB (ref 101–111)
CO2: 26 mmol/L (ref 22–32)
Calcium: 9.3 mg/dL (ref 8.9–10.3)
Creatinine, Ser: 0.85 mg/dL (ref 0.44–1.00)
GFR calc Af Amer: >60 mL/min (ref >60)
GFR calc non Af Amer: >60 mL/min (ref >60)
GLUCOSE: 122 mg/dL — AB (ref 65–99)
POTASSIUM: 3.7 mmol/L (ref 3.5–5.1)
Sodium: 135 mmol/L (ref 135–145)
TOTAL PROTEIN: 6.4 g/dL — AB (ref 6.5–8.1)

## 2015-08-25 LAB — CBC WITH DIFFERENTIAL/PLATELET
Basophils Absolute: 0.1 10*3/uL (ref 0.0–0.1)
Basophils Relative: 1 %
Eosinophils Absolute: 0.2 10*3/uL (ref 0.0–0.7)
Eosinophils Relative: 2 %
HEMATOCRIT: 42.4 % (ref 36.0–46.0)
Hemoglobin: 14.1 g/dL (ref 12.0–15.0)
LYMPHS ABS: 2.3 10*3/uL (ref 0.7–4.0)
LYMPHS PCT: 25 %
MCH: 30.7 pg (ref 26.0–34.0)
MCHC: 33.3 g/dL (ref 30.0–36.0)
MCV: 92.4 fL (ref 78.0–100.0)
Monocytes Absolute: 0.9 10*3/uL (ref 0.1–1.0)
Monocytes Relative: 9 %
NEUTROS ABS: 5.8 10*3/uL (ref 1.7–7.7)
Neutrophils Relative %: 63 %
Platelets: 243 10*3/uL (ref 150–400)
RBC: 4.59 MIL/uL (ref 3.87–5.11)
RDW: 12.7 % (ref 11.5–15.5)
WBC: 9.2 10*3/uL (ref 4.0–10.5)

## 2015-08-25 LAB — URINALYSIS, ROUTINE W REFLEX MICROSCOPIC
BILIRUBIN URINE: NEGATIVE
GLUCOSE, UA: NEGATIVE mg/dL
Hgb urine dipstick: NEGATIVE
KETONES UR: NEGATIVE mg/dL
LEUKOCYTES UA: NEGATIVE
Nitrite: NEGATIVE
PH: 7 (ref 5.0–8.0)
Protein, ur: NEGATIVE mg/dL
SPECIFIC GRAVITY, URINE: 1.005 (ref 1.005–1.030)

## 2015-08-25 LAB — D-DIMER, QUANTITATIVE (NOT AT ARMC): D DIMER QUANT: 0.5 ug{FEU}/mL (ref 0.00–0.50)

## 2015-08-25 LAB — TROPONIN I: Troponin I: <0.03 ng/mL (ref <0.031)

## 2015-08-25 MED ORDER — SODIUM CHLORIDE 0.9 % IV BOLUS (SEPSIS)
1000.0000 mL | Freq: Once | INTRAVENOUS | Status: AC
  Administered 2015-08-25: 1000 mL via INTRAVENOUS

## 2015-08-25 NOTE — ED Provider Notes (Signed)
CSN: [id number]     Arrival date & time 08/25/15  1903 History   First MD Initiated Contact with Patient 08/25/15 1904     Chief Complaint  Patient presents with  . Loss of Consciousness  PT SAID THAT SHE WAS WAITING IN LINE FOR DINNER AT HER ASSISTED LIVING FACILITY.  SHE SAID SHE PASSED OUT.  SHE DID NOT HURT HERSELF WHEN SHE PASSED OUT AS SHE WAS ASSISTED DOWN BY BYSTANDERS.  PT ESTIMATES THAT IT LASTED ABOUT 30 SECONDS.  SHE WAS AMBULATORY WITH EMS.  PT DENIES ASSOCIATED SX, ALTHOUGH SHE SAID THAT SHE DID NOT EAT MUCH TODAY.   (Consider location/radiation/quality/duration/timing/severity/associated sxs/prior Treatment) Patient is a 81 y.o. female presenting with syncope. The history is provided by the patient and the EMS personnel.  Loss of Consciousness Episode history:  Single Most recent episode:  Today Progression:  Improving Chronicity:  New Witnessed: yes     Past Medical History  Diagnosis Date  . Hypercholesterolemia   . Essential hypertension   . Hyperlipidemia   . Detached retina right    legally blind   Past Surgical History  Procedure Laterality Date  . Tumor of ovary      removal of benign   No family history on file. Social History  Substance Use Topics  . Smoking status: Never Smoker   . Smokeless tobacco: None  . Alcohol Use: No   OB History    No data available     Review of Systems  Cardiovascular: Positive for syncope.       SYNCOPE  All other systems reviewed and are negative.     Allergies  Ace inhibitors and Lipitor  Home Medications   Prior to Admission medications   Medication Sig Start Date End Date Taking? Authorizing Provider  amLODipine (NORVASC) 5 MG tablet Take 2.5 mg by mouth daily.   Yes Historical Provider, MD  b complex vitamins tablet Take 1 tablet by mouth daily. Taking super b complex daily    Yes Historical Provider, MD  Cholecalciferol (VITAMIN D PO) Take 1,000 mg by mouth daily.    Yes Historical Provider, MD   Coenzyme Q10 (COQ-10 PO) Take 1 tablet by mouth at bedtime.    Yes Historical Provider, MD  cycloSPORINE (RESTASIS) 0.05 % ophthalmic emulsion Place 1 drop into both eyes 2 (two) times daily.   Yes Historical Provider, MD  dorzolamide-timolol (COSOPT) 22.3-6.8 MG/ML ophthalmic solution Place 1 drop into both eyes 2 (two) times daily.    Yes Historical Provider, MD  fish oil-omega-3 fatty acids 1000 MG capsule Take 1 g by mouth daily.    Yes Historical Provider, MD  Flaxseed, Linseed, (FLAX SEED OIL PO) Take 1 tablet by mouth daily.    Yes Historical Provider, MD  GARLIC PO Take 1 tablet by mouth daily.    Yes Historical Provider, MD  GLUCOSAMINE PO Take 750 mg by mouth daily.     Yes Historical Provider, MD  ketorolac (ACULAR) 0.5 % ophthalmic solution  06/25/10  Yes Historical Provider, MD  LORazepam (ATIVAN) 0.5 MG tablet Take 1 tablet by mouth daily. 06/23/15  Yes Historical Provider, MD  Multiple Vitamin (MULTIVITAMIN) tablet Take 1 tablet by mouth daily.     Yes Historical Provider, MD  Multiple Vitamins-Minerals (EYE VITAMINS) CAPS Take 1 capsule by mouth daily.    Yes Historical Provider, MD  Travoprost, BAK Free, (TRAVATAMN) 0.004 % SOLN ophthalmic solution Place 1 drop into both eyes at bedtime.    Yes Historical  Provider, MD  valsartan (DIOVAN) 40 MG tablet Take 1 tablet by mouth daily. 07/24/15  Yes Historical Provider, MD  losartan (COZAAR) 100 MG tablet Take 1 tablet (100 mg total) by mouth daily. 09/06/12   Darlin Coco, MD   BP 156/71 mmHg  Pulse 62  Resp 15  SpO2 98% Physical Exam  Constitutional: She is oriented to person, place, and time. She appears well-developed and well-nourished.  HENT:  Head: Normocephalic and atraumatic.  Right Ear: External ear normal.  Left Ear: External ear normal.  Mouth/Throat: Oropharynx is clear and moist.  Eyes: Conjunctivae are normal. Pupils are equal, round, and reactive to light.  Neck: Normal range of motion. Neck supple.   Cardiovascular: Normal rate, regular rhythm, normal heart sounds and intact distal pulses.   Pulmonary/Chest: Effort normal and breath sounds normal.  Abdominal: Soft. Bowel sounds are normal.  Musculoskeletal: Normal range of motion.  Neurological: She is alert and oriented to person, place, and time.  Skin: Skin is warm and dry.  Psychiatric: She has a normal mood and affect. Her behavior is normal. Judgment and thought content normal.  Nursing note and vitals reviewed.   ED Course  Procedures (including critical care time) Labs Review Labs Reviewed  COMPREHENSIVE METABOLIC PANEL - Abnormal; Notable for the following:    Chloride 99 (*)    Glucose, Bld 122 (*)    Total Protein 6.4 (*)    All other components within normal limits  CBC WITH DIFFERENTIAL/PLATELET  URINALYSIS, ROUTINE W REFLEX MICROSCOPIC (NOT AT [facility])  TROPONIN I  D-DIMER, QUANTITATIVE (NOT AT [facility])    Imaging Review No results found. I have personally reviewed and evaluated these images and lab results as part of my medical decision-making.   EKG Interpretation   Date/Time:  Monday Aug 25 2015 19:14:37 EDT Ventricular Rate:  57 PR Interval:  221 QRS Duration: 73 QT Interval:  423 QTC Calculation: 412 R Axis:   30 Text Interpretation:  Sinus rhythm Prolonged PR interval Probable  anteroseptal infarct, old Borderline ST depression, diffuse leads  Confirmed by [facility] MD, Kyara Boxer ([id number]) on 08/25/2015 7:24:30 PM      MDM  PT IS STILL ORTHOSTATIC, BUT FEELS GREAT.  PT SAID THAT SHE WANTS TO GO HOME INSTEAD OF GET ANOTHER LITER OF FLUID.  Final diagnoses:  Hypotension, unspecified hypotension type        Isla Pence, MD 08/25/15 [phone]

## 2015-08-25 NOTE — ED Notes (Signed)
[facility], was standing in line for dinner and had syncopal episode lasting approximately 30 seconds. Patient denies injury- was assisted down by bystanders, ambulatory with EMS.

## 2015-08-25 NOTE — Discharge Instructions (Signed)
Hypotension As your heart beats, it forces blood through your body. This force is called blood pressure. If you have hypotension, you have low blood pressure. When your blood pressure is too low, you may not get enough blood to your brain. You may feel weak, feel lightheaded, have a fast heartbeat, or even pass out (faint). HOME CARE  Drink enough fluids to keep your pee (urine) clear or pale yellow.  Take all medicines as told by your doctor.  Get up slowly after sitting or lying down.  Wear support stockings as told by your doctor.  Maintain a healthy diet by including foods such as fruits, vegetables, nuts, whole grains, and lean meats. GET HELP IF:  You are throwing up (vomiting) or have watery poop (diarrhea).  You have a fever for more than 2-3 days.  You feel more thirsty than usual.  You feel weak and tired. GET HELP RIGHT AWAY IF:   You pass out (faint).  You have chest pain or a fast or irregular heartbeat.  You lose feeling in part of your body.  You cannot move your arms or legs.  You have trouble speaking.  You get sweaty or feel lightheaded. MAKE SURE YOU:   Understand these instructions.  Will watch your condition.  Will get help right away if you are not doing well or get worse.   This information is not intended to replace advice given to you by your health care provider. Make sure you discuss any questions you have with your health care provider.   Document Released: 06/30/2009 Document Revised: 12/06/2012 Document Reviewed: 10/06/2012 Elsevier Interactive Patient Education 2016 [employer] Inc[employer]  

## 2015-08-26 DIAGNOSIS — I951 Orthostatic hypotension: Secondary | ICD-10-CM | POA: Diagnosis not present

## 2015-08-26 DIAGNOSIS — Z6821 Body mass index (BMI) 21.0-21.9, adult: Secondary | ICD-10-CM | POA: Diagnosis not present

## 2015-08-26 DIAGNOSIS — R55 Syncope and collapse: Secondary | ICD-10-CM | POA: Diagnosis not present

## 2015-08-26 DIAGNOSIS — I10 Essential (primary) hypertension: Secondary | ICD-10-CM | POA: Diagnosis not present

## 2015-09-02 ENCOUNTER — Telehealth: Payer: Self-pay | Admitting: Cardiovascular Disease

## 2015-09-02 NOTE — Telephone Encounter (Signed)
Received records from [facility] for appointment on 09/09/15 with Dr Claiborne Billings.  Records given to [facility] (medical records) for Dr Evette Georges schedule on 09/09/15. lp

## 2015-09-09 ENCOUNTER — Ambulatory Visit (INDEPENDENT_AMBULATORY_CARE_PROVIDER_SITE_OTHER): Payer: Medicare Other | Admitting: Cardiovascular Disease

## 2015-09-09 ENCOUNTER — Encounter: Payer: Self-pay | Admitting: Cardiovascular Disease

## 2015-09-09 VITALS — BP 154/80 | HR 60 | Ht 65.0 in | Wt 132.2 lb

## 2015-09-09 DIAGNOSIS — E785 Hyperlipidemia, unspecified: Secondary | ICD-10-CM

## 2015-09-09 DIAGNOSIS — I119 Hypertensive heart disease without heart failure: Secondary | ICD-10-CM

## 2015-09-09 DIAGNOSIS — R55 Syncope and collapse: Secondary | ICD-10-CM | POA: Diagnosis not present

## 2015-09-09 DIAGNOSIS — R011 Cardiac murmur, unspecified: Secondary | ICD-10-CM | POA: Diagnosis not present

## 2015-09-09 DIAGNOSIS — T671XXD Heat syncope, subsequent encounter: Secondary | ICD-10-CM

## 2015-09-09 DIAGNOSIS — E7800 Pure hypercholesterolemia, unspecified: Secondary | ICD-10-CM

## 2015-09-09 DIAGNOSIS — I358 Other nonrheumatic aortic valve disorders: Secondary | ICD-10-CM

## 2015-09-09 NOTE — Progress Notes (Addendum)
Patient ID: Christine Davila, female   DOB: Jul 28, 1934, 81 y.o.   MRN: [id number]     Primary MD: Dr. Elta Guadeloupe Perrini  PATIENT PROFILE: Christine Davila is a 81 y.o. female who is referred by Dr. Abner Greenspan  for evaluation of recent syncope, which is felt most likely due to dehydration/orthostatic hypotension.   HPI:  Christine Davila admits to at least a 20 year history of hypertension.  Recently, she had been on low-dose amlodipine as well as valsartan.  In 2014, she had experienced a brief syncopal spell.  At that time, she underwent an echo Doppler study which showed an EF of 60-65%, a mildly thickened aortic valve with mild aortic insufficiency, and mild pulmonary hypertension with an estimated PA pressure 37 mm.  She had a normal carotid duplex exam and a normal abdominal ultrasound.  She is followed by Dr. Elta Guadeloupe Perrini.  Her husband passed away 3 years ago.  She has been residing at [facility].  On May 8, she admits that she had not had much to drink.  It was warm.  She was standing in line to get food and apparently had a frank syncopal spell.  I reviewed the ECG that was done by [facility] which showed normal sinus rhythm without ectopy.  She was evaluated in the emergency room and was felt to be dehydrated and orthostatic and she was treated with fluid replenishment.  She subsequently saw Dr. Abner Greenspan, and ultimately she was taken off valsartan and amlodipine altogether.  She has been monitoring her blood pressure since.  For the first 4 days.  She still had orthostatic change, but ultimately these have subsided.  Her blood pressures now have increased to approximately 140, but there was one reading of 160.  She denies any chest pain.  She remains active and does water aerobics at least 5 times per week for an hour.  She feels well.  She denies any chest pain.  She denies any palpitations.  She denies PND, orthopnea.  She denies any visual changes.  She is now referred for cardiology  evaluation.  Past Medical History  Diagnosis Date  . Hypercholesterolemia   . Essential hypertension   . Hyperlipidemia   . Detached retina right    legally blind    Past Surgical History  Procedure Laterality Date  . Tumor of ovary      removal of benign    Allergies  Allergen Reactions  . Ace Inhibitors Cough  . Lipitor [Atorvastatin Calcium] Other (See Comments)    Eye problems    Current Outpatient Prescriptions  Medication Sig Dispense Refill  . b complex vitamins tablet Take 1 tablet by mouth daily. Taking super b complex daily     . Cholecalciferol (VITAMIN D PO) Take 1,000 mg by mouth daily.     . Coenzyme Q10 (COQ-10 PO) Take 1 tablet by mouth at bedtime.     . COMBIGAN 0.2-0.5 % ophthalmic solution Place 1 drop into both eyes 2 (two) times daily.  4  . cycloSPORINE (RESTASIS) 0.05 % ophthalmic emulsion Place 1 drop into both eyes 2 (two) times daily.    . fish oil-omega-3 fatty acids 1000 MG capsule Take 1 g by mouth daily.     . Flaxseed, Linseed, (FLAX SEED OIL PO) Take 1 tablet by mouth daily.     Marland Kitchen GARLIC PO Take 1 tablet by mouth daily.     Marland Kitchen GLUCOSAMINE PO Take 750 mg by mouth daily.      Marland Kitchen  ketorolac (ACULAR) 0.5 % ophthalmic solution     . LORazepam (ATIVAN) 0.5 MG tablet Take 1 tablet by mouth daily.  1  . Multiple Vitamin (MULTIVITAMIN) tablet Take 1 tablet by mouth daily.      . Multiple Vitamins-Minerals (EYE VITAMINS) CAPS Take 1 capsule by mouth daily.     . Travoprost, BAK Free, (TRAVATAMN) 0.004 % SOLN ophthalmic solution Place 1 drop into both eyes at bedtime.      No current facility-administered medications for this visit.    Social History   Social History  . Marital Status: Widowed    Spouse Name: N/A  . Number of Children: N/A  . Years of Education: N/A   Occupational History  . Not on file.   Social History Main Topics  . Smoking status: Never Smoker   . Smokeless tobacco: Not on file  . Alcohol Use: No  . Drug Use: No  .  Sexual Activity: Not on file   Other Topics Concern  . Not on file   Social History Narrative   Additional social history is notable in that she was born in [place], [place].  With her husband's job.  She had traveled and lived in many places in the [place] prior to coming to [place].  She is now widowed.  She was a Public house manager.  She had played in the [employer] for proximally 40 years and also in [place].  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  She is retired.  Family history is notable in that her mother died at age 41 and had diabetes mellitus and heart failure.  Her father died at 19 with a ruptured aortic aneurysm.  She has one sister who is 20 and alive and well.  She has 3 children.  ROS General: Negative; No fevers, chills, or night sweats HEENT: Negative; No changes in vision or hearing, sinus congestion, difficulty swallowing Pulmonary: Negative; No cough, wheezing, shortness of breath, hemoptysis Cardiovascular:  See HPI;  GI: Negative; No nausea, vomiting, diarrhea, or abdominal pain GU: Negative; No dysuria, hematuria, or difficulty voiding Musculoskeletal: Negative; no myalgias, joint pain, or weakness Hematologic/Oncologic: Negative; no easy bruising, bleeding Endocrine: Negative; no heat/cold intolerance; no diabetes Neuro: Negative; no changes in balance, headaches Skin: Negative; No rashes or skin lesions Psychiatric: Negative; No behavioral problems, depression Sleep: Negative; No daytime sleepiness, hypersomnolence, bruxism, restless legs, hypnogagnic hallucinations Other comprehensive 14 point system review is negative   Physical Exam BP 154/80 mmHg  Pulse 60  Ht '5\' 5"'$  (1.651 m)  Wt 132 lb 3.2 oz (59.966 kg)  BMI 22.00 kg/m2  Repeat blood pressure by me was 140/88 supine and was 148/86 standing.  Wt Readings from Last 3 Encounters:  09/09/15 132  lb 3.2 oz (59.966 kg)  07/03/12 155 lb 6.4 oz (70.489 kg)  07/22/11 150 lb (68.04 kg)   General: Alert, oriented, no distress.  Skin: normal turgor, no rashes, warm and dry HEENT: Normocephalic, atraumatic. Pupils equal round and reactive to light; sclera anicteric; extraocular muscles intact; Fundi Without hemorrhages or exudates. Nose without nasal septal hypertrophy Mouth/Parynx benign; Mallinpatti scale 3 Neck: No JVD, no carotid bruits; normal carotid upstroke Lungs: clear to ausculatation and percussion; no wheezing or rales Chest wall: without tenderness to palpitation Heart: PMI not displaced, RRR, s1 s2 normal, 1/6 systolic murmur in the aortic area.  I did not up appreciate  a significant diastolic murmur, no rubs, gallops, thrills, or heaves Abdomen: soft, nontender; no hepatosplenomehaly, BS+; abdominal aorta nontender and not dilated by palpation. Back: no CVA tenderness Pulses 2+ Musculoskeletal: full range of motion, normal strength, no joint deformities Extremities: no clubbing cyanosis or edema, Homan's sign negative  Neurologic: grossly nonfocal; Cranial nerves grossly wnl Psychologic: Normal mood and affect   ECG (independently read by me): Normal sinus rhythm with a PVC with VA conduction.  Normal intervals.  No ST segment changes  LABS:  BMP Latest Ref Rng 08/25/2015 01/25/2014 06/26/2012  Glucose 65 - 99 mg/dL 122(H) 102(H) 100(H)  BUN 6 - 20 mg/dL '13 11 22  '$ Creatinine 0.44 - 1.00 mg/dL 0.85 0.76 1.1  Sodium 135 - 145 mmol/L 135 131(L) 141  Potassium 3.5 - 5.1 mmol/L 3.7 3.6(L) 3.7  Chloride 101 - 111 mmol/L 99(L) 91(L) 107  CO2 22 - 32 mmol/L '26 26 27  '$ Calcium 8.9 - 10.3 mg/dL 9.3 10.2 9.5     Hepatic Function Latest Ref Rng 08/25/2015 06/26/2012 12/28/2010  Total Protein 6.5 - 8.1 g/dL 6.4(L) 6.8 6.8  Albumin 3.5 - 5.0 g/dL 3.6 3.5 3.9  AST 15 - 41 U/L '21 18 26  '$ ALT 14 - 54 U/L 21 21 36(H)  Alk Phosphatase 38 - 126 U/L 90 76 100  Total Bilirubin 0.3 - 1.2  mg/dL 0.8 0.9 0.6  Bilirubin, Direct 0.0 - 0.3 mg/dL - 0.1 0.1    CBC Latest Ref Rng 08/25/2015 01/25/2014 06/26/2012  WBC 4.0 - 10.5 K/uL 9.2 11.7(H) 7.2  Hemoglobin 12.0 - 15.0 g/dL 14.1 14.6 13.9  Hematocrit 36.0 - 46.0 % 42.4 42.5 41.0  Platelets 150 - 400 K/uL 243 269 221.0   Lab Results  Component Value Date   MCV 92.4 08/25/2015   MCV 88.4 01/25/2014   MCV 91.2 06/26/2012   No results found for: TSH No results found for: HGBA1C   BNP No results found for: BNP  ProBNP No results found for: PROBNP   Lipid Panel     Component Value Date/Time   CHOL 211* 06/26/2012 0834   TRIG 157.0* 06/26/2012 0834   HDL 42.30 06/26/2012 0834   CHOLHDL 5 06/26/2012 0834   VLDL 31.4 06/26/2012 0834   LDLCALC 111* 12/28/2010 0903   LDLDIRECT 141.4 06/26/2012 0834    RADIOLOGY: No results found.   ASSESSMENT AND PLAN: Ms. Christine Davila is a very pleasant 81 year old female who has a long-standing history of hypertension and had been on medical therapy including low-dose amlodipine and ARB treatment.  She had recently developed a syncopal spell which may be contributed by dehydration and orthostatic hypotension.  Her orthostasis has resolved with discontinuance of her medical therapy, but her blood pressure is now in the 817 range systolically approaching 711.  I reviewed her studies from 2014. I reviewed her recent laboratory.  She also tells me that Dr. Abner Greenspan has checked her lipid studies and her total cholesterol was 198.  I am recommending she undergo a three-year follow-up echo Doppler assessment to reevaluate her systolic and diastolic function, aortic valve disease, and pulmonary hypertension.  She does not have any carotid bruits and had a normal carotid duplex scan in 2014 and this does not need to be repeated.  She is unaware of significant arrhythmia.  She will continue to monitor her heart rhythm as well as take blood pressures frequently.  If her blood pressure continues to  elevate reinstitution of antihypertensive single therapy will be necessary.  She will be going to [place] for 3 weeks.  She will monitor her pressure.  She returns she will have her echo Doppler study and I will see her back in the office for follow-up evaluation.   Troy Sine, MD, [facility] 09/09/2015 4:45 PM

## 2015-09-09 NOTE — Patient Instructions (Signed)
Your physician has requested that you have an echocardiogram. Echocardiography is a painless test that uses sound waves to create images of your heart. It provides your doctor with information about the size and shape of your heart and how well your heart's chambers and valves are working. This procedure takes approximately one hour. There are no restrictions for this procedure.   Your physician recommends that you schedule a follow-up appointment in: August

## 2015-10-10 DIAGNOSIS — S42211A Unspecified displaced fracture of surgical neck of right humerus, initial encounter for closed fracture: Secondary | ICD-10-CM | POA: Diagnosis not present

## 2015-10-10 DIAGNOSIS — S32501A Unspecified fracture of right pubis, initial encounter for closed fracture: Secondary | ICD-10-CM | POA: Diagnosis not present

## 2015-10-10 DIAGNOSIS — S32511A Fracture of superior rim of right pubis, initial encounter for closed fracture: Secondary | ICD-10-CM | POA: Diagnosis not present

## 2015-10-10 DIAGNOSIS — S42251A Displaced fracture of greater tuberosity of right humerus, initial encounter for closed fracture: Secondary | ICD-10-CM | POA: Diagnosis not present

## 2015-10-10 DIAGNOSIS — S42213A Unspecified displaced fracture of surgical neck of unspecified humerus, initial encounter for closed fracture: Secondary | ICD-10-CM | POA: Diagnosis not present

## 2015-10-13 DIAGNOSIS — S32301A Unspecified fracture of right ilium, initial encounter for closed fracture: Secondary | ICD-10-CM | POA: Diagnosis not present

## 2015-10-13 DIAGNOSIS — S42253A Displaced fracture of greater tuberosity of unspecified humerus, initial encounter for closed fracture: Secondary | ICD-10-CM | POA: Diagnosis not present

## 2015-10-13 DIAGNOSIS — S72001A Fracture of unspecified part of neck of right femur, initial encounter for closed fracture: Secondary | ICD-10-CM | POA: Diagnosis not present

## 2015-10-13 DIAGNOSIS — M79601 Pain in right arm: Secondary | ICD-10-CM | POA: Diagnosis not present

## 2015-10-13 DIAGNOSIS — S32591A Other specified fracture of right pubis, initial encounter for closed fracture: Secondary | ICD-10-CM | POA: Diagnosis not present

## 2015-10-13 DIAGNOSIS — I951 Orthostatic hypotension: Secondary | ICD-10-CM | POA: Diagnosis not present

## 2015-10-13 DIAGNOSIS — S42301A Unspecified fracture of shaft of humerus, right arm, initial encounter for closed fracture: Secondary | ICD-10-CM | POA: Diagnosis not present

## 2015-10-13 DIAGNOSIS — E876 Hypokalemia: Secondary | ICD-10-CM | POA: Diagnosis present

## 2015-10-13 DIAGNOSIS — S42201A Unspecified fracture of upper end of right humerus, initial encounter for closed fracture: Secondary | ICD-10-CM | POA: Diagnosis not present

## 2015-10-13 DIAGNOSIS — I10 Essential (primary) hypertension: Secondary | ICD-10-CM | POA: Diagnosis not present

## 2015-10-13 DIAGNOSIS — R9431 Abnormal electrocardiogram [ECG] [EKG]: Secondary | ICD-10-CM | POA: Diagnosis not present

## 2015-10-13 DIAGNOSIS — S329XXA Fracture of unspecified parts of lumbosacral spine and pelvis, initial encounter for closed fracture: Secondary | ICD-10-CM | POA: Diagnosis not present

## 2015-10-13 DIAGNOSIS — R52 Pain, unspecified: Secondary | ICD-10-CM | POA: Diagnosis not present

## 2015-10-13 DIAGNOSIS — S32501A Unspecified fracture of right pubis, initial encounter for closed fracture: Secondary | ICD-10-CM | POA: Diagnosis not present

## 2015-10-13 DIAGNOSIS — T148 Other injury of unspecified body region: Secondary | ICD-10-CM | POA: Diagnosis not present

## 2015-10-16 DIAGNOSIS — M79601 Pain in right arm: Secondary | ICD-10-CM | POA: Diagnosis not present

## 2015-10-16 DIAGNOSIS — J310 Chronic rhinitis: Secondary | ICD-10-CM | POA: Diagnosis not present

## 2015-10-16 DIAGNOSIS — W1830XD Fall on same level, unspecified, subsequent encounter: Secondary | ICD-10-CM | POA: Diagnosis not present

## 2015-10-16 DIAGNOSIS — S8011XD Contusion of right lower leg, subsequent encounter: Secondary | ICD-10-CM | POA: Diagnosis not present

## 2015-10-16 DIAGNOSIS — K5900 Constipation, unspecified: Secondary | ICD-10-CM | POA: Diagnosis not present

## 2015-10-16 DIAGNOSIS — Z9181 History of falling: Secondary | ICD-10-CM | POA: Diagnosis not present

## 2015-10-16 DIAGNOSIS — Z90710 Acquired absence of both cervix and uterus: Secondary | ICD-10-CM | POA: Diagnosis not present

## 2015-10-16 DIAGNOSIS — R9431 Abnormal electrocardiogram [ECG] [EKG]: Secondary | ICD-10-CM | POA: Diagnosis not present

## 2015-10-16 DIAGNOSIS — S42402D Unspecified fracture of lower end of left humerus, subsequent encounter for fracture with routine healing: Secondary | ICD-10-CM | POA: Diagnosis not present

## 2015-10-16 DIAGNOSIS — H04123 Dry eye syndrome of bilateral lacrimal glands: Secondary | ICD-10-CM | POA: Diagnosis not present

## 2015-10-16 DIAGNOSIS — I10 Essential (primary) hypertension: Secondary | ICD-10-CM | POA: Diagnosis not present

## 2015-10-16 DIAGNOSIS — S42201D Unspecified fracture of upper end of right humerus, subsequent encounter for fracture with routine healing: Secondary | ICD-10-CM | POA: Diagnosis not present

## 2015-10-16 DIAGNOSIS — S32591D Other specified fracture of right pubis, subsequent encounter for fracture with routine healing: Secondary | ICD-10-CM | POA: Diagnosis not present

## 2015-10-16 DIAGNOSIS — Z7409 Other reduced mobility: Secondary | ICD-10-CM | POA: Diagnosis not present

## 2015-10-16 DIAGNOSIS — E876 Hypokalemia: Secondary | ICD-10-CM | POA: Diagnosis not present

## 2015-10-16 DIAGNOSIS — S32511D Fracture of superior rim of right pubis, subsequent encounter for fracture with routine healing: Secondary | ICD-10-CM | POA: Diagnosis not present

## 2015-10-16 DIAGNOSIS — I951 Orthostatic hypotension: Secondary | ICD-10-CM | POA: Diagnosis not present

## 2015-10-16 DIAGNOSIS — S32501D Unspecified fracture of right pubis, subsequent encounter for fracture with routine healing: Secondary | ICD-10-CM | POA: Diagnosis not present

## 2015-10-16 DIAGNOSIS — R32 Unspecified urinary incontinence: Secondary | ICD-10-CM | POA: Diagnosis not present

## 2015-10-16 DIAGNOSIS — D72829 Elevated white blood cell count, unspecified: Secondary | ICD-10-CM | POA: Diagnosis not present

## 2015-10-16 DIAGNOSIS — S40021D Contusion of right upper arm, subsequent encounter: Secondary | ICD-10-CM | POA: Diagnosis not present

## 2015-10-16 DIAGNOSIS — H409 Unspecified glaucoma: Secondary | ICD-10-CM | POA: Diagnosis not present

## 2015-10-16 DIAGNOSIS — R269 Unspecified abnormalities of gait and mobility: Secondary | ICD-10-CM | POA: Diagnosis not present

## 2015-10-16 DIAGNOSIS — S42301A Unspecified fracture of shaft of humerus, right arm, initial encounter for closed fracture: Secondary | ICD-10-CM | POA: Diagnosis not present

## 2015-10-16 DIAGNOSIS — F419 Anxiety disorder, unspecified: Secondary | ICD-10-CM | POA: Diagnosis not present

## 2015-10-16 DIAGNOSIS — S42211D Unspecified displaced fracture of surgical neck of right humerus, subsequent encounter for fracture with routine healing: Secondary | ICD-10-CM | POA: Diagnosis not present

## 2015-10-16 DIAGNOSIS — S32301A Unspecified fracture of right ilium, initial encounter for closed fracture: Secondary | ICD-10-CM | POA: Diagnosis not present

## 2015-10-16 DIAGNOSIS — R531 Weakness: Secondary | ICD-10-CM | POA: Diagnosis not present

## 2015-10-23 ENCOUNTER — Ambulatory Visit: Payer: Medicare Other | Admitting: Cardiovascular Disease

## 2015-10-29 ENCOUNTER — Other Ambulatory Visit (HOSPITAL_COMMUNITY): Payer: Medicare Other

## 2015-11-03 DIAGNOSIS — S42294A Other nondisplaced fracture of upper end of right humerus, initial encounter for closed fracture: Secondary | ICD-10-CM | POA: Diagnosis not present

## 2015-11-03 DIAGNOSIS — S32501A Unspecified fracture of right pubis, initial encounter for closed fracture: Secondary | ICD-10-CM | POA: Diagnosis not present

## 2015-11-12 DIAGNOSIS — I951 Orthostatic hypotension: Secondary | ICD-10-CM | POA: Diagnosis not present

## 2015-11-12 DIAGNOSIS — S329XXS Fracture of unspecified parts of lumbosacral spine and pelvis, sequela: Secondary | ICD-10-CM | POA: Diagnosis not present

## 2015-11-12 DIAGNOSIS — M810 Age-related osteoporosis without current pathological fracture: Secondary | ICD-10-CM | POA: Diagnosis not present

## 2015-11-12 DIAGNOSIS — I10 Essential (primary) hypertension: Secondary | ICD-10-CM | POA: Diagnosis not present

## 2015-11-12 DIAGNOSIS — Z6821 Body mass index (BMI) 21.0-21.9, adult: Secondary | ICD-10-CM | POA: Diagnosis not present

## 2015-11-19 ENCOUNTER — Ambulatory Visit (HOSPITAL_COMMUNITY): Payer: Medicare Other | Attending: Cardiovascular Disease

## 2015-11-19 ENCOUNTER — Other Ambulatory Visit (HOSPITAL_COMMUNITY): Payer: Self-pay

## 2015-11-19 DIAGNOSIS — R011 Cardiac murmur, unspecified: Secondary | ICD-10-CM | POA: Insufficient documentation

## 2015-11-19 DIAGNOSIS — E785 Hyperlipidemia, unspecified: Secondary | ICD-10-CM | POA: Insufficient documentation

## 2015-11-19 DIAGNOSIS — I071 Rheumatic tricuspid insufficiency: Secondary | ICD-10-CM | POA: Diagnosis not present

## 2015-11-19 DIAGNOSIS — I351 Nonrheumatic aortic (valve) insufficiency: Secondary | ICD-10-CM | POA: Diagnosis not present

## 2015-11-19 DIAGNOSIS — R55 Syncope and collapse: Secondary | ICD-10-CM | POA: Diagnosis not present

## 2015-11-19 DIAGNOSIS — I119 Hypertensive heart disease without heart failure: Secondary | ICD-10-CM | POA: Diagnosis not present

## 2015-11-26 DIAGNOSIS — S32501D Unspecified fracture of right pubis, subsequent encounter for fracture with routine healing: Secondary | ICD-10-CM | POA: Diagnosis not present

## 2015-11-26 DIAGNOSIS — S42294D Other nondisplaced fracture of upper end of right humerus, subsequent encounter for fracture with routine healing: Secondary | ICD-10-CM | POA: Diagnosis not present

## 2015-11-28 DIAGNOSIS — I10 Essential (primary) hypertension: Secondary | ICD-10-CM | POA: Diagnosis not present

## 2015-11-28 DIAGNOSIS — R55 Syncope and collapse: Secondary | ICD-10-CM | POA: Diagnosis not present

## 2015-11-28 DIAGNOSIS — M6281 Muscle weakness (generalized): Secondary | ICD-10-CM | POA: Diagnosis not present

## 2015-11-28 DIAGNOSIS — M8580 Other specified disorders of bone density and structure, unspecified site: Secondary | ICD-10-CM | POA: Diagnosis not present

## 2015-11-28 DIAGNOSIS — M722 Plantar fascial fibromatosis: Secondary | ICD-10-CM | POA: Diagnosis not present

## 2015-11-28 DIAGNOSIS — J309 Allergic rhinitis, unspecified: Secondary | ICD-10-CM | POA: Diagnosis not present

## 2015-11-28 DIAGNOSIS — M25511 Pain in right shoulder: Secondary | ICD-10-CM | POA: Diagnosis not present

## 2015-12-01 DIAGNOSIS — M8580 Other specified disorders of bone density and structure, unspecified site: Secondary | ICD-10-CM | POA: Diagnosis not present

## 2015-12-01 DIAGNOSIS — M722 Plantar fascial fibromatosis: Secondary | ICD-10-CM | POA: Diagnosis not present

## 2015-12-01 DIAGNOSIS — J309 Allergic rhinitis, unspecified: Secondary | ICD-10-CM | POA: Diagnosis not present

## 2015-12-01 DIAGNOSIS — I10 Essential (primary) hypertension: Secondary | ICD-10-CM | POA: Diagnosis not present

## 2015-12-01 DIAGNOSIS — M6281 Muscle weakness (generalized): Secondary | ICD-10-CM | POA: Diagnosis not present

## 2015-12-01 DIAGNOSIS — M25511 Pain in right shoulder: Secondary | ICD-10-CM | POA: Diagnosis not present

## 2015-12-02 DIAGNOSIS — M6281 Muscle weakness (generalized): Secondary | ICD-10-CM | POA: Diagnosis not present

## 2015-12-02 DIAGNOSIS — M8580 Other specified disorders of bone density and structure, unspecified site: Secondary | ICD-10-CM | POA: Diagnosis not present

## 2015-12-02 DIAGNOSIS — J309 Allergic rhinitis, unspecified: Secondary | ICD-10-CM | POA: Diagnosis not present

## 2015-12-02 DIAGNOSIS — M25511 Pain in right shoulder: Secondary | ICD-10-CM | POA: Diagnosis not present

## 2015-12-02 DIAGNOSIS — M722 Plantar fascial fibromatosis: Secondary | ICD-10-CM | POA: Diagnosis not present

## 2015-12-02 DIAGNOSIS — I10 Essential (primary) hypertension: Secondary | ICD-10-CM | POA: Diagnosis not present

## 2015-12-04 DIAGNOSIS — I10 Essential (primary) hypertension: Secondary | ICD-10-CM | POA: Diagnosis not present

## 2015-12-04 DIAGNOSIS — M8580 Other specified disorders of bone density and structure, unspecified site: Secondary | ICD-10-CM | POA: Diagnosis not present

## 2015-12-04 DIAGNOSIS — M722 Plantar fascial fibromatosis: Secondary | ICD-10-CM | POA: Diagnosis not present

## 2015-12-04 DIAGNOSIS — J309 Allergic rhinitis, unspecified: Secondary | ICD-10-CM | POA: Diagnosis not present

## 2015-12-04 DIAGNOSIS — M6281 Muscle weakness (generalized): Secondary | ICD-10-CM | POA: Diagnosis not present

## 2015-12-04 DIAGNOSIS — M25511 Pain in right shoulder: Secondary | ICD-10-CM | POA: Diagnosis not present

## 2015-12-08 DIAGNOSIS — M6281 Muscle weakness (generalized): Secondary | ICD-10-CM | POA: Diagnosis not present

## 2015-12-08 DIAGNOSIS — M25511 Pain in right shoulder: Secondary | ICD-10-CM | POA: Diagnosis not present

## 2015-12-08 DIAGNOSIS — I10 Essential (primary) hypertension: Secondary | ICD-10-CM | POA: Diagnosis not present

## 2015-12-08 DIAGNOSIS — M8580 Other specified disorders of bone density and structure, unspecified site: Secondary | ICD-10-CM | POA: Diagnosis not present

## 2015-12-08 DIAGNOSIS — J309 Allergic rhinitis, unspecified: Secondary | ICD-10-CM | POA: Diagnosis not present

## 2015-12-08 DIAGNOSIS — M722 Plantar fascial fibromatosis: Secondary | ICD-10-CM | POA: Diagnosis not present

## 2015-12-09 ENCOUNTER — Telehealth: Payer: Self-pay | Admitting: Cardiovascular Disease

## 2015-12-09 DIAGNOSIS — M8580 Other specified disorders of bone density and structure, unspecified site: Secondary | ICD-10-CM | POA: Diagnosis not present

## 2015-12-09 DIAGNOSIS — M722 Plantar fascial fibromatosis: Secondary | ICD-10-CM | POA: Diagnosis not present

## 2015-12-09 DIAGNOSIS — M6281 Muscle weakness (generalized): Secondary | ICD-10-CM | POA: Diagnosis not present

## 2015-12-09 DIAGNOSIS — M25511 Pain in right shoulder: Secondary | ICD-10-CM | POA: Diagnosis not present

## 2015-12-09 DIAGNOSIS — I10 Essential (primary) hypertension: Secondary | ICD-10-CM | POA: Diagnosis not present

## 2015-12-09 DIAGNOSIS — J309 Allergic rhinitis, unspecified: Secondary | ICD-10-CM | POA: Diagnosis not present

## 2015-12-09 NOTE — Telephone Encounter (Signed)
Closed encounter °

## 2015-12-11 DIAGNOSIS — J309 Allergic rhinitis, unspecified: Secondary | ICD-10-CM | POA: Diagnosis not present

## 2015-12-11 DIAGNOSIS — I10 Essential (primary) hypertension: Secondary | ICD-10-CM | POA: Diagnosis not present

## 2015-12-11 DIAGNOSIS — M8580 Other specified disorders of bone density and structure, unspecified site: Secondary | ICD-10-CM | POA: Diagnosis not present

## 2015-12-11 DIAGNOSIS — M6281 Muscle weakness (generalized): Secondary | ICD-10-CM | POA: Diagnosis not present

## 2015-12-11 DIAGNOSIS — M722 Plantar fascial fibromatosis: Secondary | ICD-10-CM | POA: Diagnosis not present

## 2015-12-11 DIAGNOSIS — M25511 Pain in right shoulder: Secondary | ICD-10-CM | POA: Diagnosis not present

## 2015-12-15 ENCOUNTER — Ambulatory Visit: Payer: Medicare Other | Admitting: Cardiovascular Disease

## 2015-12-15 DIAGNOSIS — M6281 Muscle weakness (generalized): Secondary | ICD-10-CM | POA: Diagnosis not present

## 2015-12-15 DIAGNOSIS — M722 Plantar fascial fibromatosis: Secondary | ICD-10-CM | POA: Diagnosis not present

## 2015-12-15 DIAGNOSIS — I10 Essential (primary) hypertension: Secondary | ICD-10-CM | POA: Diagnosis not present

## 2015-12-15 DIAGNOSIS — M25511 Pain in right shoulder: Secondary | ICD-10-CM | POA: Diagnosis not present

## 2015-12-15 DIAGNOSIS — M8580 Other specified disorders of bone density and structure, unspecified site: Secondary | ICD-10-CM | POA: Diagnosis not present

## 2015-12-15 DIAGNOSIS — J309 Allergic rhinitis, unspecified: Secondary | ICD-10-CM | POA: Diagnosis not present

## 2015-12-17 DIAGNOSIS — M25511 Pain in right shoulder: Secondary | ICD-10-CM | POA: Diagnosis not present

## 2015-12-17 DIAGNOSIS — I10 Essential (primary) hypertension: Secondary | ICD-10-CM | POA: Diagnosis not present

## 2015-12-17 DIAGNOSIS — M722 Plantar fascial fibromatosis: Secondary | ICD-10-CM | POA: Diagnosis not present

## 2015-12-17 DIAGNOSIS — M6281 Muscle weakness (generalized): Secondary | ICD-10-CM | POA: Diagnosis not present

## 2015-12-17 DIAGNOSIS — M8580 Other specified disorders of bone density and structure, unspecified site: Secondary | ICD-10-CM | POA: Diagnosis not present

## 2015-12-17 DIAGNOSIS — J309 Allergic rhinitis, unspecified: Secondary | ICD-10-CM | POA: Diagnosis not present

## 2015-12-18 DIAGNOSIS — M6281 Muscle weakness (generalized): Secondary | ICD-10-CM | POA: Diagnosis not present

## 2015-12-18 DIAGNOSIS — J309 Allergic rhinitis, unspecified: Secondary | ICD-10-CM | POA: Diagnosis not present

## 2015-12-18 DIAGNOSIS — M25511 Pain in right shoulder: Secondary | ICD-10-CM | POA: Diagnosis not present

## 2015-12-18 DIAGNOSIS — I10 Essential (primary) hypertension: Secondary | ICD-10-CM | POA: Diagnosis not present

## 2015-12-18 DIAGNOSIS — M722 Plantar fascial fibromatosis: Secondary | ICD-10-CM | POA: Diagnosis not present

## 2015-12-18 DIAGNOSIS — M8580 Other specified disorders of bone density and structure, unspecified site: Secondary | ICD-10-CM | POA: Diagnosis not present

## 2015-12-23 DIAGNOSIS — M722 Plantar fascial fibromatosis: Secondary | ICD-10-CM | POA: Diagnosis not present

## 2015-12-23 DIAGNOSIS — J309 Allergic rhinitis, unspecified: Secondary | ICD-10-CM | POA: Diagnosis not present

## 2015-12-23 DIAGNOSIS — M25511 Pain in right shoulder: Secondary | ICD-10-CM | POA: Diagnosis not present

## 2015-12-23 DIAGNOSIS — M8580 Other specified disorders of bone density and structure, unspecified site: Secondary | ICD-10-CM | POA: Diagnosis not present

## 2015-12-23 DIAGNOSIS — I10 Essential (primary) hypertension: Secondary | ICD-10-CM | POA: Diagnosis not present

## 2015-12-23 DIAGNOSIS — M6281 Muscle weakness (generalized): Secondary | ICD-10-CM | POA: Diagnosis not present

## 2015-12-23 DIAGNOSIS — R55 Syncope and collapse: Secondary | ICD-10-CM | POA: Diagnosis not present

## 2015-12-24 DIAGNOSIS — M810 Age-related osteoporosis without current pathological fracture: Secondary | ICD-10-CM | POA: Diagnosis not present

## 2015-12-24 DIAGNOSIS — I10 Essential (primary) hypertension: Secondary | ICD-10-CM | POA: Diagnosis not present

## 2015-12-24 DIAGNOSIS — E784 Other hyperlipidemia: Secondary | ICD-10-CM | POA: Diagnosis not present

## 2015-12-25 DIAGNOSIS — M722 Plantar fascial fibromatosis: Secondary | ICD-10-CM | POA: Diagnosis not present

## 2015-12-25 DIAGNOSIS — J309 Allergic rhinitis, unspecified: Secondary | ICD-10-CM | POA: Diagnosis not present

## 2015-12-25 DIAGNOSIS — M8580 Other specified disorders of bone density and structure, unspecified site: Secondary | ICD-10-CM | POA: Diagnosis not present

## 2015-12-25 DIAGNOSIS — M25511 Pain in right shoulder: Secondary | ICD-10-CM | POA: Diagnosis not present

## 2015-12-25 DIAGNOSIS — M6281 Muscle weakness (generalized): Secondary | ICD-10-CM | POA: Diagnosis not present

## 2015-12-25 DIAGNOSIS — I10 Essential (primary) hypertension: Secondary | ICD-10-CM | POA: Diagnosis not present

## 2015-12-26 DIAGNOSIS — J309 Allergic rhinitis, unspecified: Secondary | ICD-10-CM | POA: Diagnosis not present

## 2015-12-26 DIAGNOSIS — M8580 Other specified disorders of bone density and structure, unspecified site: Secondary | ICD-10-CM | POA: Diagnosis not present

## 2015-12-26 DIAGNOSIS — M6281 Muscle weakness (generalized): Secondary | ICD-10-CM | POA: Diagnosis not present

## 2015-12-26 DIAGNOSIS — M25511 Pain in right shoulder: Secondary | ICD-10-CM | POA: Diagnosis not present

## 2015-12-26 DIAGNOSIS — I10 Essential (primary) hypertension: Secondary | ICD-10-CM | POA: Diagnosis not present

## 2015-12-26 DIAGNOSIS — M722 Plantar fascial fibromatosis: Secondary | ICD-10-CM | POA: Diagnosis not present

## 2015-12-29 DIAGNOSIS — S42294D Other nondisplaced fracture of upper end of right humerus, subsequent encounter for fracture with routine healing: Secondary | ICD-10-CM | POA: Diagnosis not present

## 2015-12-29 DIAGNOSIS — M8580 Other specified disorders of bone density and structure, unspecified site: Secondary | ICD-10-CM | POA: Diagnosis not present

## 2015-12-29 DIAGNOSIS — M6281 Muscle weakness (generalized): Secondary | ICD-10-CM | POA: Diagnosis not present

## 2015-12-29 DIAGNOSIS — S32591D Other specified fracture of right pubis, subsequent encounter for fracture with routine healing: Secondary | ICD-10-CM | POA: Diagnosis not present

## 2015-12-29 DIAGNOSIS — J309 Allergic rhinitis, unspecified: Secondary | ICD-10-CM | POA: Diagnosis not present

## 2015-12-29 DIAGNOSIS — M722 Plantar fascial fibromatosis: Secondary | ICD-10-CM | POA: Diagnosis not present

## 2015-12-29 DIAGNOSIS — M25511 Pain in right shoulder: Secondary | ICD-10-CM | POA: Diagnosis not present

## 2015-12-29 DIAGNOSIS — I10 Essential (primary) hypertension: Secondary | ICD-10-CM | POA: Diagnosis not present

## 2015-12-30 ENCOUNTER — Encounter (HOSPITAL_COMMUNITY): Payer: Medicare Other

## 2015-12-30 DIAGNOSIS — M6281 Muscle weakness (generalized): Secondary | ICD-10-CM | POA: Diagnosis not present

## 2015-12-30 DIAGNOSIS — M722 Plantar fascial fibromatosis: Secondary | ICD-10-CM | POA: Diagnosis not present

## 2015-12-30 DIAGNOSIS — I10 Essential (primary) hypertension: Secondary | ICD-10-CM | POA: Diagnosis not present

## 2015-12-30 DIAGNOSIS — J309 Allergic rhinitis, unspecified: Secondary | ICD-10-CM | POA: Diagnosis not present

## 2015-12-30 DIAGNOSIS — M25511 Pain in right shoulder: Secondary | ICD-10-CM | POA: Diagnosis not present

## 2015-12-30 DIAGNOSIS — M8580 Other specified disorders of bone density and structure, unspecified site: Secondary | ICD-10-CM | POA: Diagnosis not present

## 2015-12-31 DIAGNOSIS — Z1231 Encounter for screening mammogram for malignant neoplasm of breast: Secondary | ICD-10-CM | POA: Diagnosis not present

## 2015-12-31 DIAGNOSIS — Z1389 Encounter for screening for other disorder: Secondary | ICD-10-CM | POA: Diagnosis not present

## 2015-12-31 DIAGNOSIS — M722 Plantar fascial fibromatosis: Secondary | ICD-10-CM | POA: Diagnosis not present

## 2015-12-31 DIAGNOSIS — N390 Urinary tract infection, site not specified: Secondary | ICD-10-CM | POA: Diagnosis not present

## 2015-12-31 DIAGNOSIS — I10 Essential (primary) hypertension: Secondary | ICD-10-CM | POA: Diagnosis not present

## 2015-12-31 DIAGNOSIS — J3089 Other allergic rhinitis: Secondary | ICD-10-CM | POA: Diagnosis not present

## 2015-12-31 DIAGNOSIS — S329XXS Fracture of unspecified parts of lumbosacral spine and pelvis, sequela: Secondary | ICD-10-CM | POA: Diagnosis not present

## 2015-12-31 DIAGNOSIS — M810 Age-related osteoporosis without current pathological fracture: Secondary | ICD-10-CM | POA: Diagnosis not present

## 2015-12-31 DIAGNOSIS — Z23 Encounter for immunization: Secondary | ICD-10-CM | POA: Diagnosis not present

## 2015-12-31 DIAGNOSIS — Z0000 Encounter for general adult medical examination without abnormal findings: Secondary | ICD-10-CM | POA: Diagnosis not present

## 2015-12-31 DIAGNOSIS — R8299 Other abnormal findings in urine: Secondary | ICD-10-CM | POA: Diagnosis not present

## 2015-12-31 DIAGNOSIS — R55 Syncope and collapse: Secondary | ICD-10-CM | POA: Diagnosis not present

## 2015-12-31 DIAGNOSIS — E784 Other hyperlipidemia: Secondary | ICD-10-CM | POA: Diagnosis not present

## 2015-12-31 DIAGNOSIS — Z6821 Body mass index (BMI) 21.0-21.9, adult: Secondary | ICD-10-CM | POA: Diagnosis not present

## 2015-12-31 DIAGNOSIS — I951 Orthostatic hypotension: Secondary | ICD-10-CM | POA: Diagnosis not present

## 2016-01-01 DIAGNOSIS — J309 Allergic rhinitis, unspecified: Secondary | ICD-10-CM | POA: Diagnosis not present

## 2016-01-01 DIAGNOSIS — M722 Plantar fascial fibromatosis: Secondary | ICD-10-CM | POA: Diagnosis not present

## 2016-01-01 DIAGNOSIS — M25511 Pain in right shoulder: Secondary | ICD-10-CM | POA: Diagnosis not present

## 2016-01-01 DIAGNOSIS — M8580 Other specified disorders of bone density and structure, unspecified site: Secondary | ICD-10-CM | POA: Diagnosis not present

## 2016-01-01 DIAGNOSIS — M6281 Muscle weakness (generalized): Secondary | ICD-10-CM | POA: Diagnosis not present

## 2016-01-01 DIAGNOSIS — I10 Essential (primary) hypertension: Secondary | ICD-10-CM | POA: Diagnosis not present

## 2016-01-05 DIAGNOSIS — M8580 Other specified disorders of bone density and structure, unspecified site: Secondary | ICD-10-CM | POA: Diagnosis not present

## 2016-01-05 DIAGNOSIS — J309 Allergic rhinitis, unspecified: Secondary | ICD-10-CM | POA: Diagnosis not present

## 2016-01-05 DIAGNOSIS — M6281 Muscle weakness (generalized): Secondary | ICD-10-CM | POA: Diagnosis not present

## 2016-01-05 DIAGNOSIS — M25511 Pain in right shoulder: Secondary | ICD-10-CM | POA: Diagnosis not present

## 2016-01-05 DIAGNOSIS — I10 Essential (primary) hypertension: Secondary | ICD-10-CM | POA: Diagnosis not present

## 2016-01-05 DIAGNOSIS — M722 Plantar fascial fibromatosis: Secondary | ICD-10-CM | POA: Diagnosis not present

## 2016-01-07 DIAGNOSIS — M722 Plantar fascial fibromatosis: Secondary | ICD-10-CM | POA: Diagnosis not present

## 2016-01-07 DIAGNOSIS — I10 Essential (primary) hypertension: Secondary | ICD-10-CM | POA: Diagnosis not present

## 2016-01-07 DIAGNOSIS — J309 Allergic rhinitis, unspecified: Secondary | ICD-10-CM | POA: Diagnosis not present

## 2016-01-07 DIAGNOSIS — M8580 Other specified disorders of bone density and structure, unspecified site: Secondary | ICD-10-CM | POA: Diagnosis not present

## 2016-01-07 DIAGNOSIS — M25511 Pain in right shoulder: Secondary | ICD-10-CM | POA: Diagnosis not present

## 2016-01-07 DIAGNOSIS — M6281 Muscle weakness (generalized): Secondary | ICD-10-CM | POA: Diagnosis not present

## 2016-01-09 DIAGNOSIS — M722 Plantar fascial fibromatosis: Secondary | ICD-10-CM | POA: Diagnosis not present

## 2016-01-09 DIAGNOSIS — I10 Essential (primary) hypertension: Secondary | ICD-10-CM | POA: Diagnosis not present

## 2016-01-09 DIAGNOSIS — M6281 Muscle weakness (generalized): Secondary | ICD-10-CM | POA: Diagnosis not present

## 2016-01-09 DIAGNOSIS — M25511 Pain in right shoulder: Secondary | ICD-10-CM | POA: Diagnosis not present

## 2016-01-09 DIAGNOSIS — M8580 Other specified disorders of bone density and structure, unspecified site: Secondary | ICD-10-CM | POA: Diagnosis not present

## 2016-01-09 DIAGNOSIS — J309 Allergic rhinitis, unspecified: Secondary | ICD-10-CM | POA: Diagnosis not present

## 2016-01-12 DIAGNOSIS — M25511 Pain in right shoulder: Secondary | ICD-10-CM | POA: Diagnosis not present

## 2016-01-12 DIAGNOSIS — M722 Plantar fascial fibromatosis: Secondary | ICD-10-CM | POA: Diagnosis not present

## 2016-01-12 DIAGNOSIS — M8580 Other specified disorders of bone density and structure, unspecified site: Secondary | ICD-10-CM | POA: Diagnosis not present

## 2016-01-12 DIAGNOSIS — M6281 Muscle weakness (generalized): Secondary | ICD-10-CM | POA: Diagnosis not present

## 2016-01-12 DIAGNOSIS — I10 Essential (primary) hypertension: Secondary | ICD-10-CM | POA: Diagnosis not present

## 2016-01-12 DIAGNOSIS — J309 Allergic rhinitis, unspecified: Secondary | ICD-10-CM | POA: Diagnosis not present

## 2016-01-14 DIAGNOSIS — M25511 Pain in right shoulder: Secondary | ICD-10-CM | POA: Diagnosis not present

## 2016-01-14 DIAGNOSIS — M6281 Muscle weakness (generalized): Secondary | ICD-10-CM | POA: Diagnosis not present

## 2016-01-14 DIAGNOSIS — M8580 Other specified disorders of bone density and structure, unspecified site: Secondary | ICD-10-CM | POA: Diagnosis not present

## 2016-01-14 DIAGNOSIS — M722 Plantar fascial fibromatosis: Secondary | ICD-10-CM | POA: Diagnosis not present

## 2016-01-14 DIAGNOSIS — J309 Allergic rhinitis, unspecified: Secondary | ICD-10-CM | POA: Diagnosis not present

## 2016-01-14 DIAGNOSIS — I10 Essential (primary) hypertension: Secondary | ICD-10-CM | POA: Diagnosis not present

## 2016-01-16 DIAGNOSIS — J309 Allergic rhinitis, unspecified: Secondary | ICD-10-CM | POA: Diagnosis not present

## 2016-01-16 DIAGNOSIS — M25511 Pain in right shoulder: Secondary | ICD-10-CM | POA: Diagnosis not present

## 2016-01-16 DIAGNOSIS — M722 Plantar fascial fibromatosis: Secondary | ICD-10-CM | POA: Diagnosis not present

## 2016-01-16 DIAGNOSIS — M8580 Other specified disorders of bone density and structure, unspecified site: Secondary | ICD-10-CM | POA: Diagnosis not present

## 2016-01-16 DIAGNOSIS — M6281 Muscle weakness (generalized): Secondary | ICD-10-CM | POA: Diagnosis not present

## 2016-01-16 DIAGNOSIS — I10 Essential (primary) hypertension: Secondary | ICD-10-CM | POA: Diagnosis not present

## 2016-01-19 DIAGNOSIS — M6281 Muscle weakness (generalized): Secondary | ICD-10-CM | POA: Diagnosis not present

## 2016-01-19 DIAGNOSIS — R55 Syncope and collapse: Secondary | ICD-10-CM | POA: Diagnosis not present

## 2016-01-19 DIAGNOSIS — I10 Essential (primary) hypertension: Secondary | ICD-10-CM | POA: Diagnosis not present

## 2016-01-19 DIAGNOSIS — M722 Plantar fascial fibromatosis: Secondary | ICD-10-CM | POA: Diagnosis not present

## 2016-01-19 DIAGNOSIS — M8580 Other specified disorders of bone density and structure, unspecified site: Secondary | ICD-10-CM | POA: Diagnosis not present

## 2016-01-19 DIAGNOSIS — M25511 Pain in right shoulder: Secondary | ICD-10-CM | POA: Diagnosis not present

## 2016-01-19 DIAGNOSIS — J309 Allergic rhinitis, unspecified: Secondary | ICD-10-CM | POA: Diagnosis not present

## 2016-01-20 DIAGNOSIS — H353131 Nonexudative age-related macular degeneration, bilateral, early dry stage: Secondary | ICD-10-CM | POA: Diagnosis not present

## 2016-01-20 DIAGNOSIS — H3530 Unspecified macular degeneration: Secondary | ICD-10-CM | POA: Diagnosis not present

## 2016-01-20 DIAGNOSIS — H401132 Primary open-angle glaucoma, bilateral, moderate stage: Secondary | ICD-10-CM | POA: Diagnosis not present

## 2016-01-20 DIAGNOSIS — H18413 Arcus senilis, bilateral: Secondary | ICD-10-CM | POA: Diagnosis not present

## 2016-01-20 DIAGNOSIS — L233 Allergic contact dermatitis due to drugs in contact with skin: Secondary | ICD-10-CM | POA: Diagnosis not present

## 2016-01-20 DIAGNOSIS — H11153 Pinguecula, bilateral: Secondary | ICD-10-CM | POA: Diagnosis not present

## 2016-01-20 DIAGNOSIS — H04123 Dry eye syndrome of bilateral lacrimal glands: Secondary | ICD-10-CM | POA: Diagnosis not present

## 2016-01-21 DIAGNOSIS — I10 Essential (primary) hypertension: Secondary | ICD-10-CM | POA: Diagnosis not present

## 2016-01-21 DIAGNOSIS — M8580 Other specified disorders of bone density and structure, unspecified site: Secondary | ICD-10-CM | POA: Diagnosis not present

## 2016-01-21 DIAGNOSIS — M722 Plantar fascial fibromatosis: Secondary | ICD-10-CM | POA: Diagnosis not present

## 2016-01-21 DIAGNOSIS — M25511 Pain in right shoulder: Secondary | ICD-10-CM | POA: Diagnosis not present

## 2016-01-21 DIAGNOSIS — M6281 Muscle weakness (generalized): Secondary | ICD-10-CM | POA: Diagnosis not present

## 2016-01-21 DIAGNOSIS — J309 Allergic rhinitis, unspecified: Secondary | ICD-10-CM | POA: Diagnosis not present

## 2016-01-22 DIAGNOSIS — J309 Allergic rhinitis, unspecified: Secondary | ICD-10-CM | POA: Diagnosis not present

## 2016-01-22 DIAGNOSIS — M722 Plantar fascial fibromatosis: Secondary | ICD-10-CM | POA: Diagnosis not present

## 2016-01-22 DIAGNOSIS — I10 Essential (primary) hypertension: Secondary | ICD-10-CM | POA: Diagnosis not present

## 2016-01-22 DIAGNOSIS — M8580 Other specified disorders of bone density and structure, unspecified site: Secondary | ICD-10-CM | POA: Diagnosis not present

## 2016-01-22 DIAGNOSIS — M6281 Muscle weakness (generalized): Secondary | ICD-10-CM | POA: Diagnosis not present

## 2016-01-22 DIAGNOSIS — M25511 Pain in right shoulder: Secondary | ICD-10-CM | POA: Diagnosis not present

## 2016-01-26 DIAGNOSIS — M8580 Other specified disorders of bone density and structure, unspecified site: Secondary | ICD-10-CM | POA: Diagnosis not present

## 2016-01-26 DIAGNOSIS — I10 Essential (primary) hypertension: Secondary | ICD-10-CM | POA: Diagnosis not present

## 2016-01-26 DIAGNOSIS — M722 Plantar fascial fibromatosis: Secondary | ICD-10-CM | POA: Diagnosis not present

## 2016-01-26 DIAGNOSIS — J309 Allergic rhinitis, unspecified: Secondary | ICD-10-CM | POA: Diagnosis not present

## 2016-01-26 DIAGNOSIS — M25511 Pain in right shoulder: Secondary | ICD-10-CM | POA: Diagnosis not present

## 2016-01-26 DIAGNOSIS — M6281 Muscle weakness (generalized): Secondary | ICD-10-CM | POA: Diagnosis not present

## 2016-01-28 ENCOUNTER — Ambulatory Visit: Payer: Medicare Other | Admitting: Cardiovascular Disease

## 2016-01-29 ENCOUNTER — Encounter (HOSPITAL_COMMUNITY): Payer: Medicare Other

## 2016-01-30 DIAGNOSIS — I10 Essential (primary) hypertension: Secondary | ICD-10-CM | POA: Diagnosis not present

## 2016-01-30 DIAGNOSIS — M8580 Other specified disorders of bone density and structure, unspecified site: Secondary | ICD-10-CM | POA: Diagnosis not present

## 2016-01-30 DIAGNOSIS — J309 Allergic rhinitis, unspecified: Secondary | ICD-10-CM | POA: Diagnosis not present

## 2016-01-30 DIAGNOSIS — M722 Plantar fascial fibromatosis: Secondary | ICD-10-CM | POA: Diagnosis not present

## 2016-01-30 DIAGNOSIS — M6281 Muscle weakness (generalized): Secondary | ICD-10-CM | POA: Diagnosis not present

## 2016-01-30 DIAGNOSIS — M25511 Pain in right shoulder: Secondary | ICD-10-CM | POA: Diagnosis not present

## 2016-02-04 DIAGNOSIS — M25511 Pain in right shoulder: Secondary | ICD-10-CM | POA: Diagnosis not present

## 2016-02-04 DIAGNOSIS — M8580 Other specified disorders of bone density and structure, unspecified site: Secondary | ICD-10-CM | POA: Diagnosis not present

## 2016-02-04 DIAGNOSIS — J309 Allergic rhinitis, unspecified: Secondary | ICD-10-CM | POA: Diagnosis not present

## 2016-02-04 DIAGNOSIS — I10 Essential (primary) hypertension: Secondary | ICD-10-CM | POA: Diagnosis not present

## 2016-02-04 DIAGNOSIS — M6281 Muscle weakness (generalized): Secondary | ICD-10-CM | POA: Diagnosis not present

## 2016-02-04 DIAGNOSIS — M722 Plantar fascial fibromatosis: Secondary | ICD-10-CM | POA: Diagnosis not present

## 2016-02-09 DIAGNOSIS — M722 Plantar fascial fibromatosis: Secondary | ICD-10-CM | POA: Diagnosis not present

## 2016-02-09 DIAGNOSIS — M6281 Muscle weakness (generalized): Secondary | ICD-10-CM | POA: Diagnosis not present

## 2016-02-09 DIAGNOSIS — M8580 Other specified disorders of bone density and structure, unspecified site: Secondary | ICD-10-CM | POA: Diagnosis not present

## 2016-02-09 DIAGNOSIS — I10 Essential (primary) hypertension: Secondary | ICD-10-CM | POA: Diagnosis not present

## 2016-02-09 DIAGNOSIS — J309 Allergic rhinitis, unspecified: Secondary | ICD-10-CM | POA: Diagnosis not present

## 2016-02-09 DIAGNOSIS — M25511 Pain in right shoulder: Secondary | ICD-10-CM | POA: Diagnosis not present

## 2016-02-13 DIAGNOSIS — M8580 Other specified disorders of bone density and structure, unspecified site: Secondary | ICD-10-CM | POA: Diagnosis not present

## 2016-02-13 DIAGNOSIS — M25511 Pain in right shoulder: Secondary | ICD-10-CM | POA: Diagnosis not present

## 2016-02-13 DIAGNOSIS — I10 Essential (primary) hypertension: Secondary | ICD-10-CM | POA: Diagnosis not present

## 2016-02-13 DIAGNOSIS — J309 Allergic rhinitis, unspecified: Secondary | ICD-10-CM | POA: Diagnosis not present

## 2016-02-13 DIAGNOSIS — M722 Plantar fascial fibromatosis: Secondary | ICD-10-CM | POA: Diagnosis not present

## 2016-02-13 DIAGNOSIS — M6281 Muscle weakness (generalized): Secondary | ICD-10-CM | POA: Diagnosis not present

## 2016-02-16 DIAGNOSIS — I10 Essential (primary) hypertension: Secondary | ICD-10-CM | POA: Diagnosis not present

## 2016-02-16 DIAGNOSIS — M6281 Muscle weakness (generalized): Secondary | ICD-10-CM | POA: Diagnosis not present

## 2016-02-16 DIAGNOSIS — M722 Plantar fascial fibromatosis: Secondary | ICD-10-CM | POA: Diagnosis not present

## 2016-02-16 DIAGNOSIS — M25511 Pain in right shoulder: Secondary | ICD-10-CM | POA: Diagnosis not present

## 2016-02-16 DIAGNOSIS — M8580 Other specified disorders of bone density and structure, unspecified site: Secondary | ICD-10-CM | POA: Diagnosis not present

## 2016-02-16 DIAGNOSIS — J309 Allergic rhinitis, unspecified: Secondary | ICD-10-CM | POA: Diagnosis not present

## 2016-02-19 DIAGNOSIS — M25511 Pain in right shoulder: Secondary | ICD-10-CM | POA: Diagnosis not present

## 2016-02-19 DIAGNOSIS — I10 Essential (primary) hypertension: Secondary | ICD-10-CM | POA: Diagnosis not present

## 2016-02-19 DIAGNOSIS — M8580 Other specified disorders of bone density and structure, unspecified site: Secondary | ICD-10-CM | POA: Diagnosis not present

## 2016-02-19 DIAGNOSIS — J309 Allergic rhinitis, unspecified: Secondary | ICD-10-CM | POA: Diagnosis not present

## 2016-02-19 DIAGNOSIS — M6281 Muscle weakness (generalized): Secondary | ICD-10-CM | POA: Diagnosis not present

## 2016-02-19 DIAGNOSIS — M722 Plantar fascial fibromatosis: Secondary | ICD-10-CM | POA: Diagnosis not present

## 2016-02-19 DIAGNOSIS — R55 Syncope and collapse: Secondary | ICD-10-CM | POA: Diagnosis not present

## 2016-02-25 DIAGNOSIS — M8580 Other specified disorders of bone density and structure, unspecified site: Secondary | ICD-10-CM | POA: Diagnosis not present

## 2016-02-25 DIAGNOSIS — M25511 Pain in right shoulder: Secondary | ICD-10-CM | POA: Diagnosis not present

## 2016-02-25 DIAGNOSIS — M6281 Muscle weakness (generalized): Secondary | ICD-10-CM | POA: Diagnosis not present

## 2016-02-25 DIAGNOSIS — J309 Allergic rhinitis, unspecified: Secondary | ICD-10-CM | POA: Diagnosis not present

## 2016-02-25 DIAGNOSIS — I10 Essential (primary) hypertension: Secondary | ICD-10-CM | POA: Diagnosis not present

## 2016-02-25 DIAGNOSIS — M722 Plantar fascial fibromatosis: Secondary | ICD-10-CM | POA: Diagnosis not present

## 2016-02-27 DIAGNOSIS — M25511 Pain in right shoulder: Secondary | ICD-10-CM | POA: Diagnosis not present

## 2016-02-27 DIAGNOSIS — I10 Essential (primary) hypertension: Secondary | ICD-10-CM | POA: Diagnosis not present

## 2016-02-27 DIAGNOSIS — J309 Allergic rhinitis, unspecified: Secondary | ICD-10-CM | POA: Diagnosis not present

## 2016-02-27 DIAGNOSIS — M722 Plantar fascial fibromatosis: Secondary | ICD-10-CM | POA: Diagnosis not present

## 2016-02-27 DIAGNOSIS — M6281 Muscle weakness (generalized): Secondary | ICD-10-CM | POA: Diagnosis not present

## 2016-02-27 DIAGNOSIS — M8580 Other specified disorders of bone density and structure, unspecified site: Secondary | ICD-10-CM | POA: Diagnosis not present

## 2016-03-01 DIAGNOSIS — M6281 Muscle weakness (generalized): Secondary | ICD-10-CM | POA: Diagnosis not present

## 2016-03-01 DIAGNOSIS — M8580 Other specified disorders of bone density and structure, unspecified site: Secondary | ICD-10-CM | POA: Diagnosis not present

## 2016-03-01 DIAGNOSIS — J309 Allergic rhinitis, unspecified: Secondary | ICD-10-CM | POA: Diagnosis not present

## 2016-03-01 DIAGNOSIS — M25511 Pain in right shoulder: Secondary | ICD-10-CM | POA: Diagnosis not present

## 2016-03-01 DIAGNOSIS — I10 Essential (primary) hypertension: Secondary | ICD-10-CM | POA: Diagnosis not present

## 2016-03-01 DIAGNOSIS — M722 Plantar fascial fibromatosis: Secondary | ICD-10-CM | POA: Diagnosis not present

## 2016-03-03 DIAGNOSIS — M722 Plantar fascial fibromatosis: Secondary | ICD-10-CM | POA: Diagnosis not present

## 2016-03-03 DIAGNOSIS — J309 Allergic rhinitis, unspecified: Secondary | ICD-10-CM | POA: Diagnosis not present

## 2016-03-03 DIAGNOSIS — M8580 Other specified disorders of bone density and structure, unspecified site: Secondary | ICD-10-CM | POA: Diagnosis not present

## 2016-03-03 DIAGNOSIS — M25511 Pain in right shoulder: Secondary | ICD-10-CM | POA: Diagnosis not present

## 2016-03-03 DIAGNOSIS — I10 Essential (primary) hypertension: Secondary | ICD-10-CM | POA: Diagnosis not present

## 2016-03-03 DIAGNOSIS — M6281 Muscle weakness (generalized): Secondary | ICD-10-CM | POA: Diagnosis not present

## 2016-03-18 ENCOUNTER — Ambulatory Visit (INDEPENDENT_AMBULATORY_CARE_PROVIDER_SITE_OTHER): Payer: Medicare Other | Admitting: Cardiovascular Disease

## 2016-03-18 ENCOUNTER — Encounter: Payer: Self-pay | Admitting: Cardiovascular Disease

## 2016-03-18 ENCOUNTER — Telehealth: Payer: Self-pay | Admitting: Cardiovascular Disease

## 2016-03-18 VITALS — BP 156/76 | HR 62 | Ht 65.0 in | Wt 137.0 lb

## 2016-03-18 DIAGNOSIS — I351 Nonrheumatic aortic (valve) insufficiency: Secondary | ICD-10-CM

## 2016-03-18 DIAGNOSIS — R55 Syncope and collapse: Secondary | ICD-10-CM | POA: Diagnosis not present

## 2016-03-18 DIAGNOSIS — I119 Hypertensive heart disease without heart failure: Secondary | ICD-10-CM

## 2016-03-18 NOTE — Patient Instructions (Signed)
Your physician wants you to follow-up in: 6 months or sooner if needed. You will receive a reminder letter in the mail two months in advance. If you don't receive a letter, please call our office to schedule the follow-up appointment.   If you need a refill on your cardiac medications before your next appointment, please call your pharmacy. 

## 2016-03-18 NOTE — Progress Notes (Signed)
Patient ID: Christine Davila, female   DOB: [birth year], 81 y.o.   MRN: [id number]     Primary MD: Dr. Elta Guadeloupe Perrini  PATIENT PROFILE: Christine Davila is a 81 y.o. female who was initially referred to me in May 2017 by Dr. Abner Greenspan  for evaluation of syncope,.  She resents for follow-up evaluation.   HPI:  Christine Davila admits to at least a 20 year history of hypertension.  She had been on low-dose amlodipine as well as valsartan.  In 2014, she had experienced a brief syncopal spell.  At that time, she underwent an echo Doppler study which showed an EF of 60-65%, a mildly thickened aortic valve with mild aortic insufficiency, and mild pulmonary hypertension with an estimated PA pressure 37 mm.  She had a normal carotid duplex exam and a normal abdominal ultrasound.  She is followed by Dr. Elta Guadeloupe Perrini.  Her husband passed away 3 years ago.  She has been residing at [facility].  On Aug 25, 2015 she he was standing in line to get food and apparently had a frank syncopal spell.  Prior to that, she admits that she had not had anything to drink or eat and it had been a very warm day.  I reviewed the ECG that was done by [facility] which showed normal sinus rhythm without ectopy.  She was evaluated in the emergency room and was felt to be dehydrated and orthostatic and she was treated with fluid replenishment.  She subsequently saw Dr. Abner Greenspan, and ultimately she was taken off valsartan and amlodipine altogether.  She has been monitoring her blood pressure since.  For the first 4 days she still had orthostatic change, but ultimately these subsided.  Her blood pressures now have increased to approximately 140, but there was one reading of 160.  When I saw for initial evaluation her blood pressure was 154/80 - 140/88. She denied any chest pain or palpitations, PND or orthopnea.  She was active and doing water aerobics at least 5 times per week for an hour.   Subsequently, she had continue to  monitor her blood pressure and ultimately was restarted back on amlodipine 5 mg and recently has been taking losartan 12.5 mg daily.   Of note, when she was in [place] for 3 weeks in June, she had tripped and broke her arm and pelvis which ultimately healed. An echo Doppler study was done on 11/19/2015 which showed an EF of 60-65% with grade 1 diastolic dysfunction.  There was no evidence for aortic stenosis.  There was mild aortic insufficiency.  There was no mitral regurgitation.  PA pressures were normal.  There was trivial TR.  She continues to be active.  She does water aerobics at least 6 days per week.  She plays tournament bridge at least 6 days per week.  She presents for evaluation.  Past Medical History:  Diagnosis Date  . Detached retina right   legally blind  . Essential hypertension   . Hypercholesterolemia   . Hyperlipidemia     Past Surgical History:  Procedure Laterality Date  . tumor of ovary     removal of benign    Allergies  Allergen Reactions  . Ace Inhibitors Cough  . Lipitor [Atorvastatin Calcium] Other (See Comments)    Eye problems    Current Outpatient Prescriptions  Medication Sig Dispense Refill  . amLODipine (NORVASC) 5 MG tablet Take 1 tablet by mouth daily.  1  . b complex vitamins  tablet Take 1 tablet by mouth daily. Taking super b complex daily     . Cholecalciferol (VITAMIN D PO) Take 1,000 mg by mouth daily.     . Coenzyme Q10 (COQ-10 PO) Take 1 tablet by mouth at bedtime.     . COMBIGAN 0.2-0.5 % ophthalmic solution Place 1 drop into both eyes 2 (two) times daily.  4  . cycloSPORINE (RESTASIS) 0.05 % ophthalmic emulsion Place 1 drop into both eyes 2 (two) times daily.    . fish oil-omega-3 fatty acids 1000 MG capsule Take 1 g by mouth daily.     . Flaxseed, Linseed, (FLAX SEED OIL PO) Take 1 tablet by mouth daily.     Marland Kitchen GARLIC PO Take 1 tablet by mouth daily.     Marland Kitchen GLUCOSAMINE PO Take 750 mg by mouth daily.      Marland Kitchen ketorolac (ACULAR) 0.5 %  ophthalmic solution     . LORazepam (ATIVAN) 0.5 MG tablet Take 1 tablet by mouth daily.  1  . losartan (COZAAR) 25 MG tablet Take 1 tablet by mouth daily.  0  . Multiple Vitamin (MULTIVITAMIN) tablet Take 1 tablet by mouth daily.      . Multiple Vitamins-Minerals (EYE VITAMINS) CAPS Take 1 capsule by mouth daily.     . Travoprost, BAK Free, (TRAVATAMN) 0.004 % SOLN ophthalmic solution Place 1 drop into both eyes at bedtime.      No current facility-administered medications for this visit.     Social History   Social History  . Marital status: Widowed    Spouse name: N/A  . Number of children: N/A  . Years of education: N/A   Occupational History  . Not on file.   Social History Main Topics  . Smoking status: Never Smoker  . Smokeless tobacco: Not on file  . Alcohol use No  . Drug use: No  . Sexual activity: Not on file   Other Topics Concern  . Not on file   Social History Narrative  . No narrative on file   Additional social history is notable in that she was born in [place], [place].  With her husband's job.  She had traveled and lived in many places in the [place] prior to coming to [place].  She is now widowed.  She was a Public house manager.  She had [place] years and also in [place].  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  She is retired.  Family history is notable in that her mother died at age 12 and had diabetes mellitus and heart failure.  Her father died at 55 with a ruptured aortic aneurysm.  She has one sister who is 68 and alive and well.  She has 3 children.  ROS General: Negative; No fevers, chills, or night sweats HEENT: Negative; No changes in vision or hearing, sinus congestion, difficulty swallowing Pulmonary: Negative; No cough, wheezing, shortness of breath, hemoptysis Cardiovascular:  See HPI;  GI: Negative; No nausea,  vomiting, diarrhea, or abdominal pain GU: Negative; No dysuria, hematuria, or difficulty voiding Musculoskeletal: Negative; no myalgias, joint pain, or weakness Hematologic/Oncologic: Negative; no easy bruising, bleeding Endocrine: Negative; no heat/cold intolerance; no diabetes Neuro: Negative; no changes in balance, headaches Skin: Negative; No rashes or skin lesions Psychiatric: Negative; No behavioral problems, depression Sleep: Negative; No daytime sleepiness, hypersomnolence, bruxism, restless legs, hypnogagnic hallucinations Other  comprehensive 14 point system review is negative   Physical Exam BP (!) 156/76 (BP Location: Left Arm, Patient Position: Sitting, Cuff Size: Normal)   Pulse 62   Ht _0  (1.651 m)   Wt 137 lb (62.1 kg)   BMI 22.80 kg/m   Repeat blood pressure by me was 162/80 supine and 152/82, standing  Wt Readings from Last 3 Encounters:  03/18/16 137 lb (62.1 kg)  09/09/15 132 lb 3.2 oz (60 kg)  07/03/12 155 lb 6.4 oz (70.5 kg)   General: Alert, oriented, no distress.  Skin: normal turgor, no rashes, warm and dry HEENT: Normocephalic, atraumatic. Pupils equal round and reactive to light; sclera anicteric; extraocular muscles intact; Fundi Without hemorrhages or exudates. Nose without nasal septal hypertrophy Mouth/Parynx benign; Mallinpatti scale 3 Neck: No JVD, no carotid bruits; normal carotid upstroke Lungs: clear to ausculatation and percussion; no wheezing or rales Chest wall: without tenderness to palpitation Heart: PMI not displaced, RRR, s1 s2 normal, 1/6 systolic murmur in the aortic area.  I did not up appreciate a significant diastolic murmur, no rubs, gallops, thrills, or heaves Abdomen: soft, nontender; no hepatosplenomehaly, BS+; abdominal aorta nontender and not dilated by palpation. Back: no CVA tenderness Pulses 2+ Musculoskeletal: full range of motion, normal strength, no joint deformities Extremities: no clubbing cyanosis or edema,  Homan's sign negative  Neurologic: grossly nonfocal; Cranial nerves grossly wnl Psychologic: Normal mood and affect  ECG (independently read by me): Normal sinus rhythm at 62 bpm.  QRS couplets V1 V2.  Normal intervals.  No syncope ST segment changes.  May 2017 ECG (independently read by me): Normal sinus rhythm with a PVC with VA conduction.  Normal intervals.  No ST segment changes  LABS:  BMP Latest Ref Rng & Units 08/25/2015 01/25/2014 06/26/2012  Glucose 65 - 99 mg/dL 122(H) 102(H) 100(H)  BUN 6 - 20 mg/dL _1 Creatinine 0.44 - 1.00 mg/dL 0.85 0.76 1.1  Sodium 135 - 145 mmol/L 135 131(L) 141  Potassium 3.5 - 5.1 mmol/L 3.7 3.6(L) 3.7  Chloride 101 - 111 mmol/L 99(L) 91(L) 107  CO2 22 - 32 mmol/L _2 Calcium 8.9 - 10.3 mg/dL 9.3 10.2 9.5     Hepatic Function Latest Ref Rng & Units 08/25/2015 06/26/2012 12/28/2010  Total Protein 6.5 - 8.1 g/dL 6.4(L) 6.8 6.8  Albumin 3.5 - 5.0 g/dL 3.6 3.5 3.9  AST 15 - 41 U/L _3 ALT 14 - 54 U/L 21 21 36(H)  Alk Phosphatase 38 - 126 U/L 90 76 100  Total Bilirubin 0.3 - 1.2 mg/dL 0.8 0.9 0.6  Bilirubin, Direct 0.0 - 0.3 mg/dL - 0.1 0.1    CBC Latest Ref Rng & Units 08/25/2015 01/25/2014 06/26/2012  WBC 4.0 - 10.5 K/uL 9.2 11.7(H) 7.2  Hemoglobin 12.0 - 15.0 g/dL 14.1 14.6 13.9  Hematocrit 36.0 - 46.0 % 42.4 42.5 41.0  Platelets 150 - 400 K/uL 243 269 221.0   Lab Results  Component Value Date   MCV 92.4 08/25/2015   MCV 88.4 01/25/2014   MCV 91.2 06/26/2012   No results found for: TSH No results found for: HGBA1C   BNP No results found for: BNP  ProBNP No results found for: PROBNP   Lipid Panel     Component Value Date/Time   CHOL 211 (H) 06/26/2012 0834   TRIG 157.0 (H) 06/26/2012 0834   HDL 42.30 06/26/2012 0834   CHOLHDL 5 06/26/2012 0834   VLDL 31.4 06/26/2012  2182   LDLCALC 111 (H) 12/28/2010 0903   LDLDIRECT 141.4 06/26/2012 0834    RADIOLOGY: No results found.   ASSESSMENT AND PLAN: Ms. Amamda Curbow is a very pleasant 81 year old female who has a long-standing history of hypertension and had been on medical therapy including low-dose amlodipine and ARB treatment. And I initially saw her in May 2017, she had recently experienced a syncopal spell which may have been contributed by dehydration and orthostatic hypotension.  Her orthostasis has resolved with discontinuance of her medical therapy, but her blood pressure gradually increased.  Most recently, she has been back on amlodipine at 5 mg daily and has been taking losartan 12.5 mg every morning.  She denies episodes of chest pain.  She denies any episodes of presyncope or syncope since that episode and denies orthostatic symptoms.  She continues to do water aerobics without difficulty.  Her blood pressure today is elevated.  I discussed with her the recent updated guidelines put forth by the American Heart Association.  Presently, I have recommended slight additional titration of losartan to 25 mg daily and she will take this at bedtime.  She will monitor her blood pressure to make certain it does not get low. She may require further dose titration of losartan if her blood pressure stays elevated.  She will be seen her primary physician in follow-up who will be rechecking laboratory.  I reviewed her echo Doppler dated with her in detail, which shows normal systolic function with grade 1 diastolic dysfunction.  There was no evidence for a aortic stenosis and there was just mild AR.  In contrast to her echo of 2014, her pulmonary artery pressures were now normal.  I will see her in 6 months for cardiology reevaluation.  Time spent: 25 minutes  Christine Sine, MD, [facility] 03/18/2016 9:00 AM

## 2016-03-18 NOTE — Telephone Encounter (Signed)
New message      Pt was seen today.  She forgot to ask Dr Claiborne Billings to send her PCP---Dr Elta Guadeloupe Perini---a copy of today's office note.  If any questions, please call.

## 2016-03-18 NOTE — Telephone Encounter (Signed)
Progress note sent to PCP as requested

## 2016-05-27 DIAGNOSIS — L821 Other seborrheic keratosis: Secondary | ICD-10-CM | POA: Diagnosis not present

## 2016-05-27 DIAGNOSIS — D1801 Hemangioma of skin and subcutaneous tissue: Secondary | ICD-10-CM | POA: Diagnosis not present

## 2016-05-27 DIAGNOSIS — L308 Other specified dermatitis: Secondary | ICD-10-CM | POA: Diagnosis not present

## 2016-05-27 DIAGNOSIS — D225 Melanocytic nevi of trunk: Secondary | ICD-10-CM | POA: Diagnosis not present

## 2016-05-27 DIAGNOSIS — D692 Other nonthrombocytopenic purpura: Secondary | ICD-10-CM | POA: Diagnosis not present

## 2016-05-27 DIAGNOSIS — Z86018 Personal history of other benign neoplasm: Secondary | ICD-10-CM | POA: Diagnosis not present

## 2016-05-27 DIAGNOSIS — L814 Other melanin hyperpigmentation: Secondary | ICD-10-CM | POA: Diagnosis not present

## 2016-05-27 DIAGNOSIS — Z23 Encounter for immunization: Secondary | ICD-10-CM | POA: Diagnosis not present

## 2016-06-15 ENCOUNTER — Other Ambulatory Visit: Payer: Self-pay | Admitting: Internal Medicine

## 2016-06-15 DIAGNOSIS — Z1231 Encounter for screening mammogram for malignant neoplasm of breast: Secondary | ICD-10-CM

## 2016-07-01 ENCOUNTER — Ambulatory Visit
Admission: RE | Admit: 2016-07-01 | Discharge: 2016-07-01 | Disposition: A | Discharge status: Home / Self Care | Payer: Medicare Other | Source: Ambulatory Visit | Attending: Internal Medicine | Admitting: Internal Medicine

## 2016-07-01 DIAGNOSIS — Z1231 Encounter for screening mammogram for malignant neoplasm of breast: Secondary | ICD-10-CM

## 2016-07-08 DIAGNOSIS — E784 Other hyperlipidemia: Secondary | ICD-10-CM | POA: Diagnosis not present

## 2016-07-08 DIAGNOSIS — Z6822 Body mass index (BMI) 22.0-22.9, adult: Secondary | ICD-10-CM | POA: Diagnosis not present

## 2016-07-08 DIAGNOSIS — M810 Age-related osteoporosis without current pathological fracture: Secondary | ICD-10-CM | POA: Diagnosis not present

## 2016-07-08 DIAGNOSIS — I10 Essential (primary) hypertension: Secondary | ICD-10-CM | POA: Diagnosis not present

## 2016-07-21 DIAGNOSIS — M810 Age-related osteoporosis without current pathological fracture: Secondary | ICD-10-CM | POA: Diagnosis not present

## 2016-08-11 DIAGNOSIS — H18413 Arcus senilis, bilateral: Secondary | ICD-10-CM | POA: Diagnosis not present

## 2016-08-11 DIAGNOSIS — H5202 Hypermetropia, left eye: Secondary | ICD-10-CM | POA: Diagnosis not present

## 2016-08-11 DIAGNOSIS — H11153 Pinguecula, bilateral: Secondary | ICD-10-CM | POA: Diagnosis not present

## 2016-08-11 DIAGNOSIS — H401132 Primary open-angle glaucoma, bilateral, moderate stage: Secondary | ICD-10-CM | POA: Diagnosis not present

## 2016-08-11 DIAGNOSIS — H11423 Conjunctival edema, bilateral: Secondary | ICD-10-CM | POA: Diagnosis not present

## 2016-08-11 DIAGNOSIS — H04123 Dry eye syndrome of bilateral lacrimal glands: Secondary | ICD-10-CM | POA: Diagnosis not present

## 2016-08-11 DIAGNOSIS — H5211 Myopia, right eye: Secondary | ICD-10-CM | POA: Diagnosis not present

## 2016-08-11 DIAGNOSIS — H353131 Nonexudative age-related macular degeneration, bilateral, early dry stage: Secondary | ICD-10-CM | POA: Diagnosis not present

## 2016-08-11 DIAGNOSIS — H16143 Punctate keratitis, bilateral: Secondary | ICD-10-CM | POA: Diagnosis not present

## 2016-08-11 DIAGNOSIS — H59811 Chorioretinal scars after surgery for detachment, right eye: Secondary | ICD-10-CM | POA: Diagnosis not present

## 2016-08-11 DIAGNOSIS — H33001 Unspecified retinal detachment with retinal break, right eye: Secondary | ICD-10-CM | POA: Diagnosis not present

## 2016-08-11 DIAGNOSIS — H401131 Primary open-angle glaucoma, bilateral, mild stage: Secondary | ICD-10-CM | POA: Diagnosis not present

## 2016-11-18 DIAGNOSIS — S0501XA Injury of conjunctiva and corneal abrasion without foreign body, right eye, initial encounter: Secondary | ICD-10-CM | POA: Diagnosis not present

## 2016-11-18 DIAGNOSIS — H353131 Nonexudative age-related macular degeneration, bilateral, early dry stage: Secondary | ICD-10-CM | POA: Diagnosis not present

## 2016-11-18 DIAGNOSIS — H401132 Primary open-angle glaucoma, bilateral, moderate stage: Secondary | ICD-10-CM | POA: Diagnosis not present

## 2016-11-18 DIAGNOSIS — H33001 Unspecified retinal detachment with retinal break, right eye: Secondary | ICD-10-CM | POA: Diagnosis not present

## 2016-11-18 DIAGNOSIS — H04123 Dry eye syndrome of bilateral lacrimal glands: Secondary | ICD-10-CM | POA: Diagnosis not present

## 2016-11-18 DIAGNOSIS — Z961 Presence of intraocular lens: Secondary | ICD-10-CM | POA: Diagnosis not present

## 2016-11-18 DIAGNOSIS — H11423 Conjunctival edema, bilateral: Secondary | ICD-10-CM | POA: Diagnosis not present

## 2016-11-18 DIAGNOSIS — H18413 Arcus senilis, bilateral: Secondary | ICD-10-CM | POA: Diagnosis not present

## 2016-11-18 DIAGNOSIS — H47233 Glaucomatous optic atrophy, bilateral: Secondary | ICD-10-CM | POA: Diagnosis not present

## 2016-11-18 DIAGNOSIS — H11153 Pinguecula, bilateral: Secondary | ICD-10-CM | POA: Diagnosis not present

## 2017-01-15 DIAGNOSIS — Z23 Encounter for immunization: Secondary | ICD-10-CM | POA: Diagnosis not present

## 2017-01-24 DIAGNOSIS — M810 Age-related osteoporosis without current pathological fracture: Secondary | ICD-10-CM | POA: Diagnosis not present

## 2017-02-10 DIAGNOSIS — E7849 Other hyperlipidemia: Secondary | ICD-10-CM | POA: Diagnosis not present

## 2017-02-10 DIAGNOSIS — N390 Urinary tract infection, site not specified: Secondary | ICD-10-CM | POA: Diagnosis not present

## 2017-02-10 DIAGNOSIS — I10 Essential (primary) hypertension: Secondary | ICD-10-CM | POA: Diagnosis not present

## 2017-02-10 DIAGNOSIS — M810 Age-related osteoporosis without current pathological fracture: Secondary | ICD-10-CM | POA: Diagnosis not present

## 2017-02-10 DIAGNOSIS — Z0000 Encounter for general adult medical examination without abnormal findings: Secondary | ICD-10-CM | POA: Diagnosis not present

## 2017-02-17 DIAGNOSIS — J3089 Other allergic rhinitis: Secondary | ICD-10-CM | POA: Diagnosis not present

## 2017-02-17 DIAGNOSIS — I10 Essential (primary) hypertension: Secondary | ICD-10-CM | POA: Diagnosis not present

## 2017-02-17 DIAGNOSIS — Z1231 Encounter for screening mammogram for malignant neoplasm of breast: Secondary | ICD-10-CM | POA: Diagnosis not present

## 2017-02-17 DIAGNOSIS — E7849 Other hyperlipidemia: Secondary | ICD-10-CM | POA: Diagnosis not present

## 2017-02-17 DIAGNOSIS — Z6823 Body mass index (BMI) 23.0-23.9, adult: Secondary | ICD-10-CM | POA: Diagnosis not present

## 2017-02-17 DIAGNOSIS — M722 Plantar fascial fibromatosis: Secondary | ICD-10-CM | POA: Diagnosis not present

## 2017-02-17 DIAGNOSIS — N390 Urinary tract infection, site not specified: Secondary | ICD-10-CM | POA: Diagnosis not present

## 2017-02-17 DIAGNOSIS — R808 Other proteinuria: Secondary | ICD-10-CM | POA: Diagnosis not present

## 2017-02-17 DIAGNOSIS — M810 Age-related osteoporosis without current pathological fracture: Secondary | ICD-10-CM | POA: Diagnosis not present

## 2017-02-17 DIAGNOSIS — Z0000 Encounter for general adult medical examination without abnormal findings: Secondary | ICD-10-CM | POA: Diagnosis not present

## 2017-02-17 DIAGNOSIS — R55 Syncope and collapse: Secondary | ICD-10-CM | POA: Diagnosis not present

## 2017-02-17 DIAGNOSIS — Z1389 Encounter for screening for other disorder: Secondary | ICD-10-CM | POA: Diagnosis not present

## 2017-02-21 DIAGNOSIS — Z1212 Encounter for screening for malignant neoplasm of rectum: Secondary | ICD-10-CM | POA: Diagnosis not present

## 2017-04-30 DIAGNOSIS — J209 Acute bronchitis, unspecified: Secondary | ICD-10-CM | POA: Diagnosis not present

## 2017-06-09 DIAGNOSIS — D1801 Hemangioma of skin and subcutaneous tissue: Secondary | ICD-10-CM | POA: Diagnosis not present

## 2017-06-09 DIAGNOSIS — L570 Actinic keratosis: Secondary | ICD-10-CM | POA: Diagnosis not present

## 2017-06-09 DIAGNOSIS — L814 Other melanin hyperpigmentation: Secondary | ICD-10-CM | POA: Diagnosis not present

## 2017-06-09 DIAGNOSIS — L821 Other seborrheic keratosis: Secondary | ICD-10-CM | POA: Diagnosis not present

## 2017-06-09 DIAGNOSIS — Z86018 Personal history of other benign neoplasm: Secondary | ICD-10-CM | POA: Diagnosis not present

## 2017-06-09 DIAGNOSIS — L84 Corns and callosities: Secondary | ICD-10-CM | POA: Diagnosis not present

## 2017-06-09 DIAGNOSIS — D225 Melanocytic nevi of trunk: Secondary | ICD-10-CM | POA: Diagnosis not present

## 2017-06-09 DIAGNOSIS — L853 Xerosis cutis: Secondary | ICD-10-CM | POA: Diagnosis not present

## 2017-06-09 DIAGNOSIS — Z23 Encounter for immunization: Secondary | ICD-10-CM | POA: Diagnosis not present

## 2017-06-20 DIAGNOSIS — H33001 Unspecified retinal detachment with retinal break, right eye: Secondary | ICD-10-CM | POA: Diagnosis not present

## 2017-06-20 DIAGNOSIS — H47233 Glaucomatous optic atrophy, bilateral: Secondary | ICD-10-CM | POA: Diagnosis not present

## 2017-06-20 DIAGNOSIS — H35363 Drusen (degenerative) of macula, bilateral: Secondary | ICD-10-CM | POA: Diagnosis not present

## 2017-06-20 DIAGNOSIS — H353131 Nonexudative age-related macular degeneration, bilateral, early dry stage: Secondary | ICD-10-CM | POA: Diagnosis not present

## 2017-06-20 DIAGNOSIS — H401231 Low-tension glaucoma, bilateral, mild stage: Secondary | ICD-10-CM | POA: Diagnosis not present

## 2017-06-20 DIAGNOSIS — H04123 Dry eye syndrome of bilateral lacrimal glands: Secondary | ICD-10-CM | POA: Diagnosis not present

## 2017-06-20 DIAGNOSIS — H4720 Unspecified optic atrophy: Secondary | ICD-10-CM | POA: Diagnosis not present

## 2017-06-20 DIAGNOSIS — H11153 Pinguecula, bilateral: Secondary | ICD-10-CM | POA: Diagnosis not present

## 2017-06-20 DIAGNOSIS — H18413 Arcus senilis, bilateral: Secondary | ICD-10-CM | POA: Diagnosis not present

## 2017-06-20 DIAGNOSIS — H538 Other visual disturbances: Secondary | ICD-10-CM | POA: Diagnosis not present

## 2017-06-20 DIAGNOSIS — H401131 Primary open-angle glaucoma, bilateral, mild stage: Secondary | ICD-10-CM | POA: Diagnosis not present

## 2017-07-27 DIAGNOSIS — M810 Age-related osteoporosis without current pathological fracture: Secondary | ICD-10-CM | POA: Diagnosis not present

## 2017-10-10 DIAGNOSIS — M21611 Bunion of right foot: Secondary | ICD-10-CM | POA: Diagnosis not present

## 2017-10-10 DIAGNOSIS — M79671 Pain in right foot: Secondary | ICD-10-CM | POA: Diagnosis not present

## 2017-10-10 DIAGNOSIS — M7751 Other enthesopathy of right foot: Secondary | ICD-10-CM | POA: Diagnosis not present

## 2017-10-13 DIAGNOSIS — H04123 Dry eye syndrome of bilateral lacrimal glands: Secondary | ICD-10-CM | POA: Diagnosis not present

## 2017-10-13 DIAGNOSIS — H35433 Paving stone degeneration of retina, bilateral: Secondary | ICD-10-CM | POA: Diagnosis not present

## 2017-10-13 DIAGNOSIS — H18413 Arcus senilis, bilateral: Secondary | ICD-10-CM | POA: Diagnosis not present

## 2017-10-13 DIAGNOSIS — H0100B Unspecified blepharitis left eye, upper and lower eyelids: Secondary | ICD-10-CM | POA: Diagnosis not present

## 2017-10-13 DIAGNOSIS — H353131 Nonexudative age-related macular degeneration, bilateral, early dry stage: Secondary | ICD-10-CM | POA: Diagnosis not present

## 2017-10-13 DIAGNOSIS — H11153 Pinguecula, bilateral: Secondary | ICD-10-CM | POA: Diagnosis not present

## 2017-10-13 DIAGNOSIS — Z9842 Cataract extraction status, left eye: Secondary | ICD-10-CM | POA: Diagnosis not present

## 2017-10-13 DIAGNOSIS — Z961 Presence of intraocular lens: Secondary | ICD-10-CM | POA: Diagnosis not present

## 2017-10-13 DIAGNOSIS — H401231 Low-tension glaucoma, bilateral, mild stage: Secondary | ICD-10-CM | POA: Diagnosis not present

## 2017-10-13 DIAGNOSIS — H0100A Unspecified blepharitis right eye, upper and lower eyelids: Secondary | ICD-10-CM | POA: Diagnosis not present

## 2017-10-13 DIAGNOSIS — Z9841 Cataract extraction status, right eye: Secondary | ICD-10-CM | POA: Diagnosis not present

## 2017-10-13 DIAGNOSIS — H3530 Unspecified macular degeneration: Secondary | ICD-10-CM | POA: Diagnosis not present

## 2017-12-30 ENCOUNTER — Other Ambulatory Visit: Payer: Self-pay | Admitting: Internal Medicine

## 2017-12-30 DIAGNOSIS — Z1231 Encounter for screening mammogram for malignant neoplasm of breast: Secondary | ICD-10-CM

## 2018-01-04 ENCOUNTER — Ambulatory Visit
Admission: RE | Admit: 2018-01-04 | Discharge: 2018-01-04 | Disposition: A | Discharge status: Home / Self Care | Payer: Medicare Other | Source: Ambulatory Visit | Attending: Internal Medicine | Admitting: Internal Medicine

## 2018-01-04 DIAGNOSIS — Z1231 Encounter for screening mammogram for malignant neoplasm of breast: Secondary | ICD-10-CM | POA: Diagnosis not present

## 2018-01-05 DIAGNOSIS — H401231 Low-tension glaucoma, bilateral, mild stage: Secondary | ICD-10-CM | POA: Diagnosis not present

## 2018-01-05 DIAGNOSIS — H0100A Unspecified blepharitis right eye, upper and lower eyelids: Secondary | ICD-10-CM | POA: Diagnosis not present

## 2018-01-05 DIAGNOSIS — Z961 Presence of intraocular lens: Secondary | ICD-10-CM | POA: Diagnosis not present

## 2018-01-05 DIAGNOSIS — H4720 Unspecified optic atrophy: Secondary | ICD-10-CM | POA: Diagnosis not present

## 2018-01-05 DIAGNOSIS — H353131 Nonexudative age-related macular degeneration, bilateral, early dry stage: Secondary | ICD-10-CM | POA: Diagnosis not present

## 2018-01-05 DIAGNOSIS — H47233 Glaucomatous optic atrophy, bilateral: Secondary | ICD-10-CM | POA: Diagnosis not present

## 2018-01-05 DIAGNOSIS — H0100B Unspecified blepharitis left eye, upper and lower eyelids: Secondary | ICD-10-CM | POA: Diagnosis not present

## 2018-01-05 DIAGNOSIS — H11823 Conjunctivochalasis, bilateral: Secondary | ICD-10-CM | POA: Diagnosis not present

## 2018-01-05 DIAGNOSIS — H11153 Pinguecula, bilateral: Secondary | ICD-10-CM | POA: Diagnosis not present

## 2018-01-05 DIAGNOSIS — H35431 Paving stone degeneration of retina, right eye: Secondary | ICD-10-CM | POA: Diagnosis not present

## 2018-01-05 DIAGNOSIS — H04123 Dry eye syndrome of bilateral lacrimal glands: Secondary | ICD-10-CM | POA: Diagnosis not present

## 2018-01-05 DIAGNOSIS — H35412 Lattice degeneration of retina, left eye: Secondary | ICD-10-CM | POA: Diagnosis not present

## 2018-01-14 DIAGNOSIS — Z23 Encounter for immunization: Secondary | ICD-10-CM | POA: Diagnosis not present

## 2018-01-27 DIAGNOSIS — M810 Age-related osteoporosis without current pathological fracture: Secondary | ICD-10-CM | POA: Diagnosis not present

## 2018-03-21 DIAGNOSIS — M810 Age-related osteoporosis without current pathological fracture: Secondary | ICD-10-CM | POA: Diagnosis not present

## 2018-03-21 DIAGNOSIS — R82998 Other abnormal findings in urine: Secondary | ICD-10-CM | POA: Diagnosis not present

## 2018-03-21 DIAGNOSIS — E7849 Other hyperlipidemia: Secondary | ICD-10-CM | POA: Diagnosis not present

## 2018-03-21 DIAGNOSIS — I10 Essential (primary) hypertension: Secondary | ICD-10-CM | POA: Diagnosis not present

## 2018-03-27 DIAGNOSIS — I10 Essential (primary) hypertension: Secondary | ICD-10-CM | POA: Diagnosis not present

## 2018-03-27 DIAGNOSIS — M810 Age-related osteoporosis without current pathological fracture: Secondary | ICD-10-CM | POA: Diagnosis not present

## 2018-03-27 DIAGNOSIS — R55 Syncope and collapse: Secondary | ICD-10-CM | POA: Diagnosis not present

## 2018-03-27 DIAGNOSIS — Z6824 Body mass index (BMI) 24.0-24.9, adult: Secondary | ICD-10-CM | POA: Diagnosis not present

## 2018-03-27 DIAGNOSIS — M722 Plantar fascial fibromatosis: Secondary | ICD-10-CM | POA: Diagnosis not present

## 2018-03-27 DIAGNOSIS — Z1389 Encounter for screening for other disorder: Secondary | ICD-10-CM | POA: Diagnosis not present

## 2018-03-27 DIAGNOSIS — S329XXS Fracture of unspecified parts of lumbosacral spine and pelvis, sequela: Secondary | ICD-10-CM | POA: Diagnosis not present

## 2018-03-27 DIAGNOSIS — Z0000 Encounter for general adult medical examination without abnormal findings: Secondary | ICD-10-CM | POA: Diagnosis not present

## 2018-03-27 DIAGNOSIS — J3089 Other allergic rhinitis: Secondary | ICD-10-CM | POA: Diagnosis not present

## 2018-03-27 DIAGNOSIS — E7849 Other hyperlipidemia: Secondary | ICD-10-CM | POA: Diagnosis not present

## 2018-03-28 DIAGNOSIS — Z1212 Encounter for screening for malignant neoplasm of rectum: Secondary | ICD-10-CM | POA: Diagnosis not present

## 2018-05-04 DIAGNOSIS — H401231 Low-tension glaucoma, bilateral, mild stage: Secondary | ICD-10-CM | POA: Diagnosis not present

## 2018-05-07 DIAGNOSIS — H53122 Transient visual loss, left eye: Secondary | ICD-10-CM | POA: Diagnosis not present

## 2018-06-15 DIAGNOSIS — L309 Dermatitis, unspecified: Secondary | ICD-10-CM | POA: Diagnosis not present

## 2018-06-15 DIAGNOSIS — L814 Other melanin hyperpigmentation: Secondary | ICD-10-CM | POA: Diagnosis not present

## 2018-06-15 DIAGNOSIS — L821 Other seborrheic keratosis: Secondary | ICD-10-CM | POA: Diagnosis not present

## 2018-06-15 DIAGNOSIS — Z23 Encounter for immunization: Secondary | ICD-10-CM | POA: Diagnosis not present

## 2018-06-15 DIAGNOSIS — Z86018 Personal history of other benign neoplasm: Secondary | ICD-10-CM | POA: Diagnosis not present

## 2018-06-15 DIAGNOSIS — L570 Actinic keratosis: Secondary | ICD-10-CM | POA: Diagnosis not present

## 2018-06-15 DIAGNOSIS — D225 Melanocytic nevi of trunk: Secondary | ICD-10-CM | POA: Diagnosis not present

## 2018-12-05 DIAGNOSIS — L570 Actinic keratosis: Secondary | ICD-10-CM | POA: Diagnosis not present

## 2018-12-05 DIAGNOSIS — L820 Inflamed seborrheic keratosis: Secondary | ICD-10-CM | POA: Diagnosis not present

## 2018-12-05 DIAGNOSIS — L821 Other seborrheic keratosis: Secondary | ICD-10-CM | POA: Diagnosis not present

## 2018-12-06 DIAGNOSIS — M810 Age-related osteoporosis without current pathological fracture: Secondary | ICD-10-CM | POA: Diagnosis not present

## 2018-12-29 DIAGNOSIS — B079 Viral wart, unspecified: Secondary | ICD-10-CM | POA: Diagnosis not present

## 2018-12-29 DIAGNOSIS — M21621 Bunionette of right foot: Secondary | ICD-10-CM | POA: Diagnosis not present

## 2019-01-12 DIAGNOSIS — B079 Viral wart, unspecified: Secondary | ICD-10-CM | POA: Diagnosis not present

## 2019-02-02 DIAGNOSIS — H11153 Pinguecula, bilateral: Secondary | ICD-10-CM | POA: Diagnosis not present

## 2019-02-02 DIAGNOSIS — H35431 Paving stone degeneration of retina, right eye: Secondary | ICD-10-CM | POA: Diagnosis not present

## 2019-02-02 DIAGNOSIS — H16223 Keratoconjunctivitis sicca, not specified as Sjogren's, bilateral: Secondary | ICD-10-CM | POA: Diagnosis not present

## 2019-02-02 DIAGNOSIS — H0102B Squamous blepharitis left eye, upper and lower eyelids: Secondary | ICD-10-CM | POA: Diagnosis not present

## 2019-02-02 DIAGNOSIS — H35412 Lattice degeneration of retina, left eye: Secondary | ICD-10-CM | POA: Diagnosis not present

## 2019-02-02 DIAGNOSIS — Z961 Presence of intraocular lens: Secondary | ICD-10-CM | POA: Diagnosis not present

## 2019-02-02 DIAGNOSIS — H47211 Primary optic atrophy, right eye: Secondary | ICD-10-CM | POA: Diagnosis not present

## 2019-02-02 DIAGNOSIS — H401231 Low-tension glaucoma, bilateral, mild stage: Secondary | ICD-10-CM | POA: Diagnosis not present

## 2019-02-02 DIAGNOSIS — H11823 Conjunctivochalasis, bilateral: Secondary | ICD-10-CM | POA: Diagnosis not present

## 2019-02-02 DIAGNOSIS — H353131 Nonexudative age-related macular degeneration, bilateral, early dry stage: Secondary | ICD-10-CM | POA: Diagnosis not present

## 2019-02-02 DIAGNOSIS — H53122 Transient visual loss, left eye: Secondary | ICD-10-CM | POA: Diagnosis not present

## 2019-02-02 DIAGNOSIS — H0102A Squamous blepharitis right eye, upper and lower eyelids: Secondary | ICD-10-CM | POA: Diagnosis not present

## 2019-04-23 DIAGNOSIS — S329XXS Fracture of unspecified parts of lumbosacral spine and pelvis, sequela: Secondary | ICD-10-CM | POA: Diagnosis not present

## 2019-04-23 DIAGNOSIS — N390 Urinary tract infection, site not specified: Secondary | ICD-10-CM | POA: Diagnosis not present

## 2019-04-23 DIAGNOSIS — Z23 Encounter for immunization: Secondary | ICD-10-CM | POA: Diagnosis not present

## 2019-04-23 DIAGNOSIS — R809 Proteinuria, unspecified: Secondary | ICD-10-CM | POA: Diagnosis not present

## 2019-04-23 DIAGNOSIS — Z0000 Encounter for general adult medical examination without abnormal findings: Secondary | ICD-10-CM | POA: Diagnosis not present

## 2019-04-23 DIAGNOSIS — R55 Syncope and collapse: Secondary | ICD-10-CM | POA: Diagnosis not present

## 2019-04-23 DIAGNOSIS — Z283 Underimmunization status: Secondary | ICD-10-CM | POA: Diagnosis not present

## 2019-04-23 DIAGNOSIS — E785 Hyperlipidemia, unspecified: Secondary | ICD-10-CM | POA: Diagnosis not present

## 2019-04-23 DIAGNOSIS — Z1389 Encounter for screening for other disorder: Secondary | ICD-10-CM | POA: Diagnosis not present

## 2019-04-23 DIAGNOSIS — M722 Plantar fascial fibromatosis: Secondary | ICD-10-CM | POA: Diagnosis not present

## 2019-04-23 DIAGNOSIS — I951 Orthostatic hypotension: Secondary | ICD-10-CM | POA: Diagnosis not present

## 2019-04-23 DIAGNOSIS — J309 Allergic rhinitis, unspecified: Secondary | ICD-10-CM | POA: Diagnosis not present

## 2019-04-23 DIAGNOSIS — I10 Essential (primary) hypertension: Secondary | ICD-10-CM | POA: Diagnosis not present

## 2019-04-23 DIAGNOSIS — M810 Age-related osteoporosis without current pathological fracture: Secondary | ICD-10-CM | POA: Diagnosis not present

## 2019-04-30 DIAGNOSIS — I951 Orthostatic hypotension: Secondary | ICD-10-CM | POA: Diagnosis not present

## 2019-04-30 DIAGNOSIS — J309 Allergic rhinitis, unspecified: Secondary | ICD-10-CM | POA: Diagnosis not present

## 2019-04-30 DIAGNOSIS — I10 Essential (primary) hypertension: Secondary | ICD-10-CM | POA: Diagnosis not present

## 2019-04-30 DIAGNOSIS — R809 Proteinuria, unspecified: Secondary | ICD-10-CM | POA: Diagnosis not present

## 2019-04-30 DIAGNOSIS — Z1331 Encounter for screening for depression: Secondary | ICD-10-CM | POA: Diagnosis not present

## 2019-04-30 DIAGNOSIS — E785 Hyperlipidemia, unspecified: Secondary | ICD-10-CM | POA: Diagnosis not present

## 2019-04-30 DIAGNOSIS — Z0000 Encounter for general adult medical examination without abnormal findings: Secondary | ICD-10-CM | POA: Diagnosis not present

## 2019-04-30 DIAGNOSIS — M810 Age-related osteoporosis without current pathological fracture: Secondary | ICD-10-CM | POA: Diagnosis not present

## 2019-05-12 ENCOUNTER — Other Ambulatory Visit: Payer: Self-pay | Admitting: Internal Medicine

## 2019-05-12 DIAGNOSIS — E785 Hyperlipidemia, unspecified: Secondary | ICD-10-CM

## 2019-05-21 DIAGNOSIS — Z23 Encounter for immunization: Secondary | ICD-10-CM | POA: Diagnosis not present

## 2019-05-24 DIAGNOSIS — R8290 Unspecified abnormal findings in urine: Secondary | ICD-10-CM | POA: Diagnosis not present

## 2019-05-24 DIAGNOSIS — Z131 Encounter for screening for diabetes mellitus: Secondary | ICD-10-CM | POA: Diagnosis not present

## 2019-06-12 DIAGNOSIS — M810 Age-related osteoporosis without current pathological fracture: Secondary | ICD-10-CM | POA: Diagnosis not present

## 2019-06-27 DIAGNOSIS — H16223 Keratoconjunctivitis sicca, not specified as Sjogren's, bilateral: Secondary | ICD-10-CM | POA: Diagnosis not present

## 2019-06-27 DIAGNOSIS — H401231 Low-tension glaucoma, bilateral, mild stage: Secondary | ICD-10-CM | POA: Diagnosis not present

## 2019-06-27 DIAGNOSIS — H0102A Squamous blepharitis right eye, upper and lower eyelids: Secondary | ICD-10-CM | POA: Diagnosis not present

## 2019-06-27 DIAGNOSIS — H01112 Allergic dermatitis of right lower eyelid: Secondary | ICD-10-CM | POA: Diagnosis not present

## 2019-06-27 DIAGNOSIS — H01115 Allergic dermatitis of left lower eyelid: Secondary | ICD-10-CM | POA: Diagnosis not present

## 2019-06-27 DIAGNOSIS — H43812 Vitreous degeneration, left eye: Secondary | ICD-10-CM | POA: Diagnosis not present

## 2019-06-27 DIAGNOSIS — H0102B Squamous blepharitis left eye, upper and lower eyelids: Secondary | ICD-10-CM | POA: Diagnosis not present

## 2019-06-27 DIAGNOSIS — H31091 Other chorioretinal scars, right eye: Secondary | ICD-10-CM | POA: Diagnosis not present

## 2019-06-27 DIAGNOSIS — H01114 Allergic dermatitis of left upper eyelid: Secondary | ICD-10-CM | POA: Diagnosis not present

## 2019-06-27 DIAGNOSIS — H01111 Allergic dermatitis of right upper eyelid: Secondary | ICD-10-CM | POA: Diagnosis not present

## 2019-06-27 DIAGNOSIS — H353131 Nonexudative age-related macular degeneration, bilateral, early dry stage: Secondary | ICD-10-CM | POA: Diagnosis not present

## 2019-06-27 DIAGNOSIS — H1045 Other chronic allergic conjunctivitis: Secondary | ICD-10-CM | POA: Diagnosis not present

## 2019-07-03 ENCOUNTER — Other Ambulatory Visit: Payer: Medicare Other

## 2019-07-06 DIAGNOSIS — H0102B Squamous blepharitis left eye, upper and lower eyelids: Secondary | ICD-10-CM | POA: Diagnosis not present

## 2019-07-06 DIAGNOSIS — T1511XA Foreign body in conjunctival sac, right eye, initial encounter: Secondary | ICD-10-CM | POA: Diagnosis not present

## 2019-07-06 DIAGNOSIS — H401132 Primary open-angle glaucoma, bilateral, moderate stage: Secondary | ICD-10-CM | POA: Diagnosis not present

## 2019-07-06 DIAGNOSIS — H0102A Squamous blepharitis right eye, upper and lower eyelids: Secondary | ICD-10-CM | POA: Diagnosis not present

## 2019-07-06 DIAGNOSIS — Z961 Presence of intraocular lens: Secondary | ICD-10-CM | POA: Diagnosis not present

## 2019-07-06 DIAGNOSIS — H16223 Keratoconjunctivitis sicca, not specified as Sjogren's, bilateral: Secondary | ICD-10-CM | POA: Diagnosis not present

## 2019-07-12 DIAGNOSIS — H1010 Acute atopic conjunctivitis, unspecified eye: Secondary | ICD-10-CM | POA: Diagnosis not present

## 2019-07-12 DIAGNOSIS — H02403 Unspecified ptosis of bilateral eyelids: Secondary | ICD-10-CM | POA: Diagnosis not present

## 2019-07-12 DIAGNOSIS — H109 Unspecified conjunctivitis: Secondary | ICD-10-CM | POA: Diagnosis not present

## 2019-07-12 DIAGNOSIS — H527 Unspecified disorder of refraction: Secondary | ICD-10-CM | POA: Diagnosis not present

## 2019-07-12 DIAGNOSIS — H33051 Total retinal detachment, right eye: Secondary | ICD-10-CM | POA: Diagnosis not present

## 2019-07-12 DIAGNOSIS — Z79899 Other long term (current) drug therapy: Secondary | ICD-10-CM | POA: Diagnosis not present

## 2019-07-12 DIAGNOSIS — H409 Unspecified glaucoma: Secondary | ICD-10-CM | POA: Diagnosis not present

## 2019-07-18 DIAGNOSIS — L578 Other skin changes due to chronic exposure to nonionizing radiation: Secondary | ICD-10-CM | POA: Diagnosis not present

## 2019-07-18 DIAGNOSIS — D2261 Melanocytic nevi of right upper limb, including shoulder: Secondary | ICD-10-CM | POA: Diagnosis not present

## 2019-07-18 DIAGNOSIS — L814 Other melanin hyperpigmentation: Secondary | ICD-10-CM | POA: Diagnosis not present

## 2019-07-18 DIAGNOSIS — D485 Neoplasm of uncertain behavior of skin: Secondary | ICD-10-CM | POA: Diagnosis not present

## 2019-07-18 DIAGNOSIS — D225 Melanocytic nevi of trunk: Secondary | ICD-10-CM | POA: Diagnosis not present

## 2019-07-18 DIAGNOSIS — L570 Actinic keratosis: Secondary | ICD-10-CM | POA: Diagnosis not present

## 2019-07-18 DIAGNOSIS — Z86018 Personal history of other benign neoplasm: Secondary | ICD-10-CM | POA: Diagnosis not present

## 2019-07-18 DIAGNOSIS — H01119 Allergic dermatitis of unspecified eye, unspecified eyelid: Secondary | ICD-10-CM | POA: Diagnosis not present

## 2019-07-18 DIAGNOSIS — L821 Other seborrheic keratosis: Secondary | ICD-10-CM | POA: Diagnosis not present

## 2019-08-17 DIAGNOSIS — T85398D Other mechanical complication of other ocular prosthetic devices, implants and grafts, subsequent encounter: Secondary | ICD-10-CM | POA: Diagnosis not present

## 2019-08-17 DIAGNOSIS — H401132 Primary open-angle glaucoma, bilateral, moderate stage: Secondary | ICD-10-CM | POA: Diagnosis not present

## 2019-08-17 DIAGNOSIS — H3321 Serous retinal detachment, right eye: Secondary | ICD-10-CM | POA: Diagnosis not present

## 2019-08-17 DIAGNOSIS — Z961 Presence of intraocular lens: Secondary | ICD-10-CM | POA: Diagnosis not present

## 2019-08-17 DIAGNOSIS — H3581 Retinal edema: Secondary | ICD-10-CM | POA: Diagnosis not present

## 2019-11-16 DIAGNOSIS — H3581 Retinal edema: Secondary | ICD-10-CM | POA: Diagnosis not present

## 2019-11-16 DIAGNOSIS — Z961 Presence of intraocular lens: Secondary | ICD-10-CM | POA: Diagnosis not present

## 2019-11-16 DIAGNOSIS — H401132 Primary open-angle glaucoma, bilateral, moderate stage: Secondary | ICD-10-CM | POA: Diagnosis not present

## 2019-11-16 DIAGNOSIS — T85398D Other mechanical complication of other ocular prosthetic devices, implants and grafts, subsequent encounter: Secondary | ICD-10-CM | POA: Diagnosis not present

## 2019-11-16 DIAGNOSIS — H3321 Serous retinal detachment, right eye: Secondary | ICD-10-CM | POA: Diagnosis not present

## 2019-11-27 DIAGNOSIS — E785 Hyperlipidemia, unspecified: Secondary | ICD-10-CM | POA: Diagnosis not present

## 2019-11-27 DIAGNOSIS — M810 Age-related osteoporosis without current pathological fracture: Secondary | ICD-10-CM | POA: Diagnosis not present

## 2019-11-27 DIAGNOSIS — I951 Orthostatic hypotension: Secondary | ICD-10-CM | POA: Diagnosis not present

## 2019-11-27 DIAGNOSIS — I10 Essential (primary) hypertension: Secondary | ICD-10-CM | POA: Diagnosis not present

## 2019-12-10 DIAGNOSIS — M810 Age-related osteoporosis without current pathological fracture: Secondary | ICD-10-CM | POA: Diagnosis not present

## 2019-12-29 IMAGING — MG DIGITAL SCREENING BILATERAL MAMMOGRAM WITH TOMO AND CAD
6 of 10 series · 6 of 30 positions shown · non-contrast
Comparison: Previous exam(s).

CLINICAL DATA: Screening.

EXAM:
DIGITAL SCREENING BILATERAL MAMMOGRAM WITH TOMO AND CAD

[R MLO synth-2D (1 of 2)]
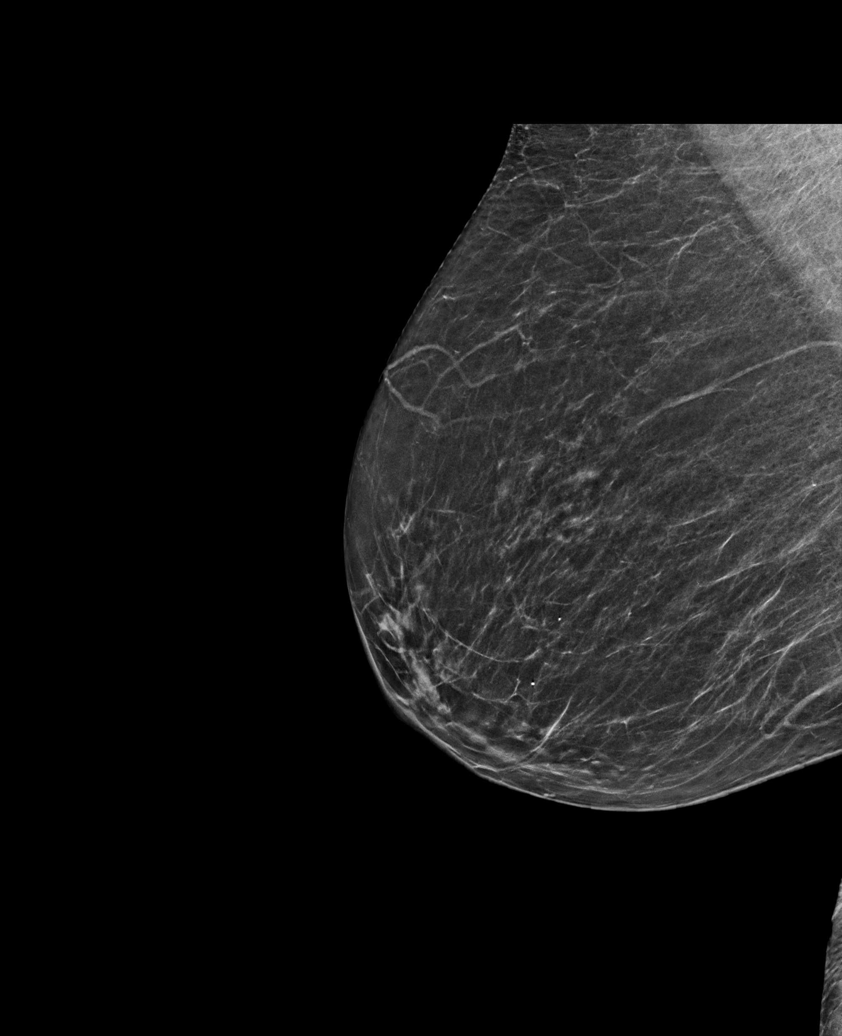

[R CC synth-2D]
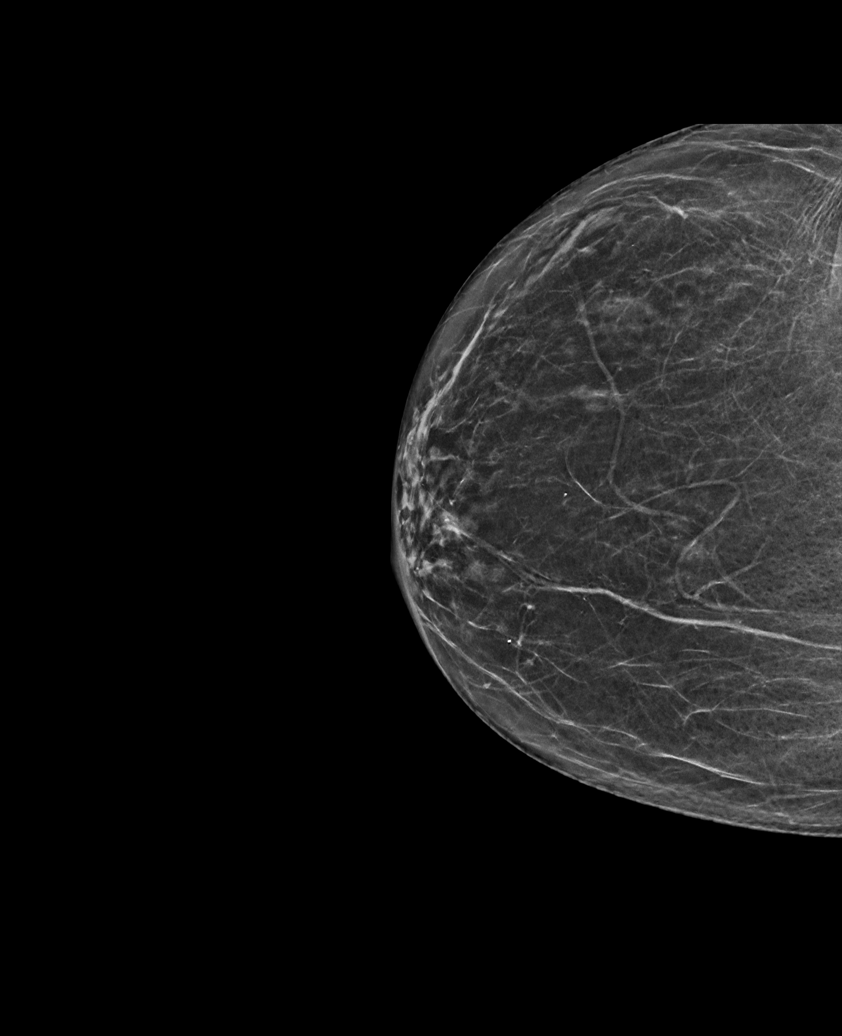

[L MLO synth-2D]
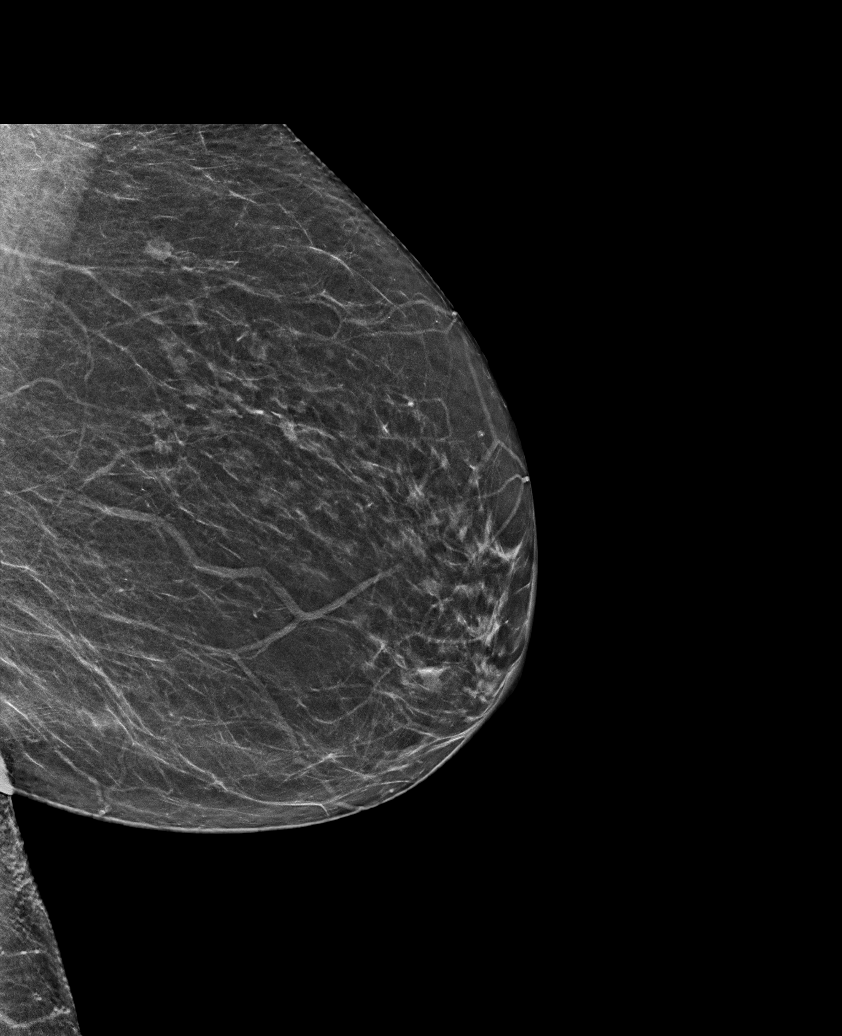

[R MLO synth-2D (2 of 2)]
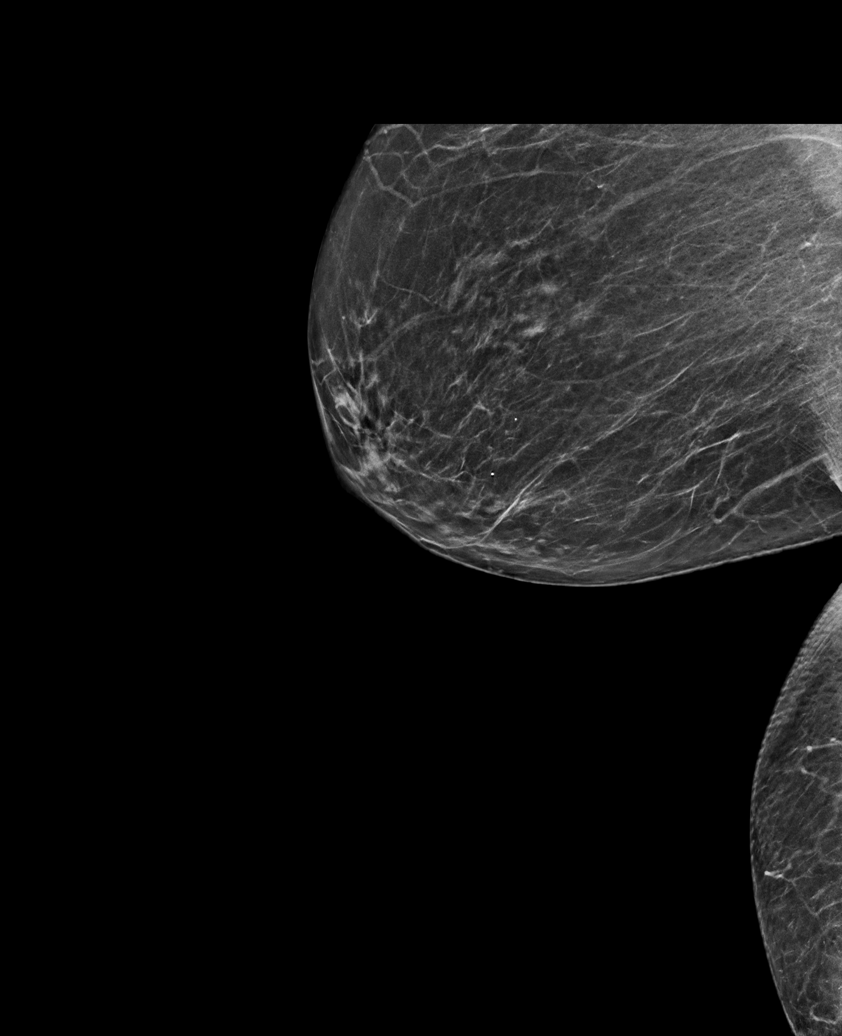

[L CC synth-2D]
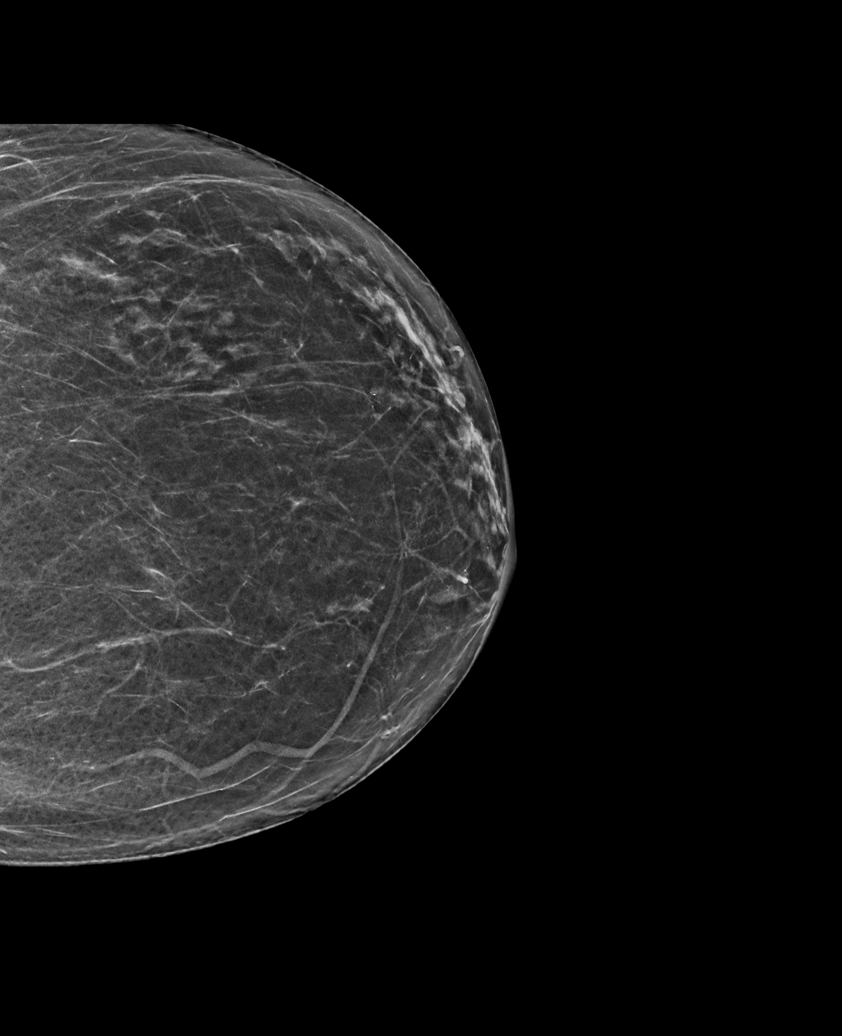

[L MLO tomo · tomo slice 31/62.0]
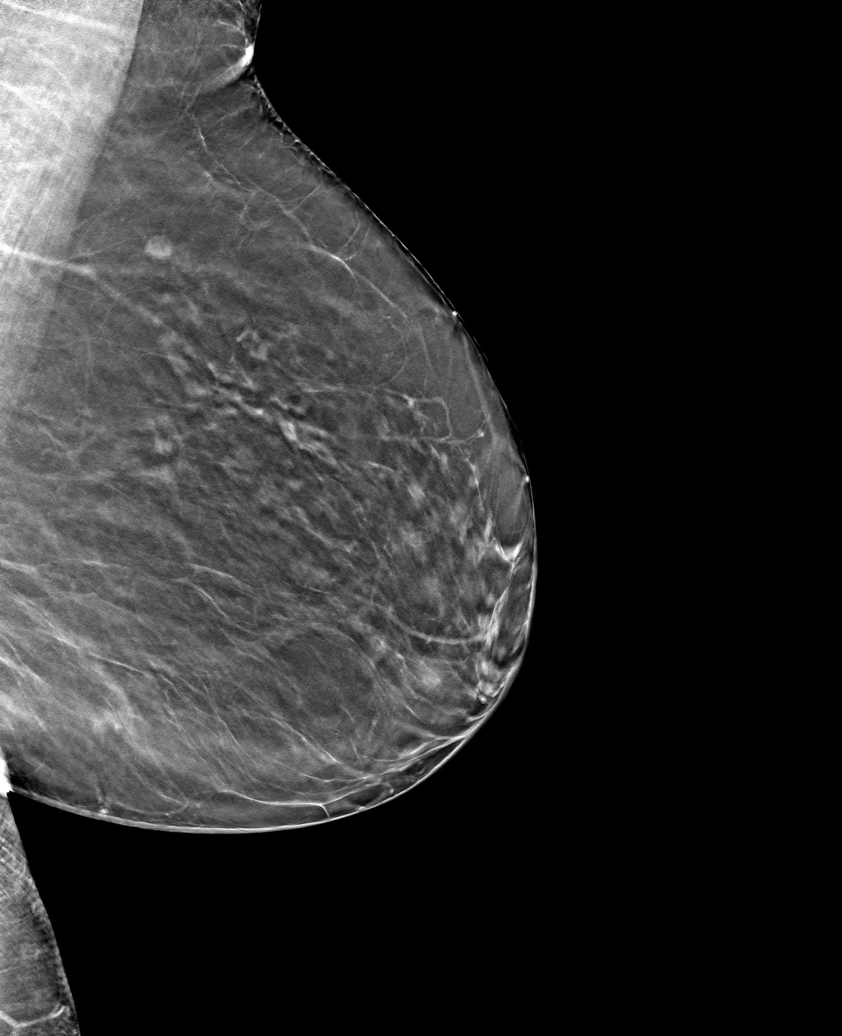

[6 of 30 positions shown; findings below may reference images not displayed]

ACR Breast Density Category b: There are scattered areas of
fibroglandular density.
FINDINGS: There are no findings suspicious for malignancy. Images were
processed with CAD.
IMPRESSION: No mammographic evidence of malignancy. A result letter of this
screening mammogram will be mailed directly to the patient.

RECOMMENDATION:
Screening mammogram in one year. (Code:[id number])

BI-RADS CATEGORY  1: Negative.

## 2020-01-29 DIAGNOSIS — Z23 Encounter for immunization: Secondary | ICD-10-CM | POA: Diagnosis not present

## 2020-02-21 DIAGNOSIS — H401132 Primary open-angle glaucoma, bilateral, moderate stage: Secondary | ICD-10-CM | POA: Diagnosis not present

## 2020-03-03 DIAGNOSIS — Z23 Encounter for immunization: Secondary | ICD-10-CM | POA: Diagnosis not present

## 2020-06-17 DIAGNOSIS — M810 Age-related osteoporosis without current pathological fracture: Secondary | ICD-10-CM | POA: Diagnosis not present

## 2020-06-24 DIAGNOSIS — M810 Age-related osteoporosis without current pathological fracture: Secondary | ICD-10-CM | POA: Diagnosis not present

## 2020-07-17 DIAGNOSIS — Z86018 Personal history of other benign neoplasm: Secondary | ICD-10-CM | POA: Diagnosis not present

## 2020-07-17 DIAGNOSIS — L578 Other skin changes due to chronic exposure to nonionizing radiation: Secondary | ICD-10-CM | POA: Diagnosis not present

## 2020-07-17 DIAGNOSIS — L821 Other seborrheic keratosis: Secondary | ICD-10-CM | POA: Diagnosis not present

## 2020-07-17 DIAGNOSIS — D225 Melanocytic nevi of trunk: Secondary | ICD-10-CM | POA: Diagnosis not present

## 2020-07-17 DIAGNOSIS — H01119 Allergic dermatitis of unspecified eye, unspecified eyelid: Secondary | ICD-10-CM | POA: Diagnosis not present

## 2020-07-17 DIAGNOSIS — L814 Other melanin hyperpigmentation: Secondary | ICD-10-CM | POA: Diagnosis not present

## 2020-07-22 DIAGNOSIS — H3321 Serous retinal detachment, right eye: Secondary | ICD-10-CM | POA: Diagnosis not present

## 2020-07-22 DIAGNOSIS — T85398D Other mechanical complication of other ocular prosthetic devices, implants and grafts, subsequent encounter: Secondary | ICD-10-CM | POA: Diagnosis not present

## 2020-07-22 DIAGNOSIS — Z961 Presence of intraocular lens: Secondary | ICD-10-CM | POA: Diagnosis not present

## 2020-07-22 DIAGNOSIS — H401132 Primary open-angle glaucoma, bilateral, moderate stage: Secondary | ICD-10-CM | POA: Diagnosis not present

## 2020-07-22 DIAGNOSIS — H3581 Retinal edema: Secondary | ICD-10-CM | POA: Diagnosis not present

## 2020-08-08 DIAGNOSIS — Z23 Encounter for immunization: Secondary | ICD-10-CM | POA: Diagnosis not present

## 2020-08-18 DIAGNOSIS — Z0000 Encounter for general adult medical examination without abnormal findings: Secondary | ICD-10-CM | POA: Diagnosis not present

## 2020-08-18 DIAGNOSIS — M810 Age-related osteoporosis without current pathological fracture: Secondary | ICD-10-CM | POA: Diagnosis not present

## 2020-08-18 DIAGNOSIS — E785 Hyperlipidemia, unspecified: Secondary | ICD-10-CM | POA: Diagnosis not present

## 2020-08-18 DIAGNOSIS — I10 Essential (primary) hypertension: Secondary | ICD-10-CM | POA: Diagnosis not present

## 2020-08-29 DIAGNOSIS — I10 Essential (primary) hypertension: Secondary | ICD-10-CM | POA: Diagnosis not present

## 2020-08-29 DIAGNOSIS — J309 Allergic rhinitis, unspecified: Secondary | ICD-10-CM | POA: Diagnosis not present

## 2020-08-29 DIAGNOSIS — E785 Hyperlipidemia, unspecified: Secondary | ICD-10-CM | POA: Diagnosis not present

## 2020-08-29 DIAGNOSIS — N390 Urinary tract infection, site not specified: Secondary | ICD-10-CM | POA: Diagnosis not present

## 2020-08-29 DIAGNOSIS — R809 Proteinuria, unspecified: Secondary | ICD-10-CM | POA: Diagnosis not present

## 2020-08-29 DIAGNOSIS — M810 Age-related osteoporosis without current pathological fracture: Secondary | ICD-10-CM | POA: Diagnosis not present

## 2020-08-29 DIAGNOSIS — Z0000 Encounter for general adult medical examination without abnormal findings: Secondary | ICD-10-CM | POA: Diagnosis not present

## 2020-08-29 DIAGNOSIS — Z1389 Encounter for screening for other disorder: Secondary | ICD-10-CM | POA: Diagnosis not present

## 2020-10-21 DIAGNOSIS — M810 Age-related osteoporosis without current pathological fracture: Secondary | ICD-10-CM | POA: Diagnosis not present

## 2020-10-21 DIAGNOSIS — S329XXS Fracture of unspecified parts of lumbosacral spine and pelvis, sequela: Secondary | ICD-10-CM | POA: Diagnosis not present

## 2020-11-27 DIAGNOSIS — M810 Age-related osteoporosis without current pathological fracture: Secondary | ICD-10-CM | POA: Diagnosis not present

## 2020-12-19 DIAGNOSIS — H401132 Primary open-angle glaucoma, bilateral, moderate stage: Secondary | ICD-10-CM | POA: Diagnosis not present

## 2020-12-26 DIAGNOSIS — M810 Age-related osteoporosis without current pathological fracture: Secondary | ICD-10-CM | POA: Diagnosis not present

## 2021-01-07 DIAGNOSIS — Z23 Encounter for immunization: Secondary | ICD-10-CM | POA: Diagnosis not present

## 2021-01-22 DIAGNOSIS — Z23 Encounter for immunization: Secondary | ICD-10-CM | POA: Diagnosis not present

## 2021-03-17 DIAGNOSIS — H3581 Retinal edema: Secondary | ICD-10-CM | POA: Diagnosis not present

## 2021-03-17 DIAGNOSIS — Z961 Presence of intraocular lens: Secondary | ICD-10-CM | POA: Diagnosis not present

## 2021-03-17 DIAGNOSIS — T85398D Other mechanical complication of other ocular prosthetic devices, implants and grafts, subsequent encounter: Secondary | ICD-10-CM | POA: Diagnosis not present

## 2021-03-17 DIAGNOSIS — H401132 Primary open-angle glaucoma, bilateral, moderate stage: Secondary | ICD-10-CM | POA: Diagnosis not present

## 2021-03-17 DIAGNOSIS — H3321 Serous retinal detachment, right eye: Secondary | ICD-10-CM | POA: Diagnosis not present

## 2021-05-14 DIAGNOSIS — H401132 Primary open-angle glaucoma, bilateral, moderate stage: Secondary | ICD-10-CM | POA: Diagnosis not present

## 2021-06-24 DIAGNOSIS — M810 Age-related osteoporosis without current pathological fracture: Secondary | ICD-10-CM | POA: Diagnosis not present

## 2021-06-29 DIAGNOSIS — M810 Age-related osteoporosis without current pathological fracture: Secondary | ICD-10-CM | POA: Diagnosis not present

## 2021-07-20 DIAGNOSIS — Z86018 Personal history of other benign neoplasm: Secondary | ICD-10-CM | POA: Diagnosis not present

## 2021-07-20 DIAGNOSIS — L820 Inflamed seborrheic keratosis: Secondary | ICD-10-CM | POA: Diagnosis not present

## 2021-07-20 DIAGNOSIS — D225 Melanocytic nevi of trunk: Secondary | ICD-10-CM | POA: Diagnosis not present

## 2021-07-20 DIAGNOSIS — L821 Other seborrheic keratosis: Secondary | ICD-10-CM | POA: Diagnosis not present

## 2021-07-20 DIAGNOSIS — L309 Dermatitis, unspecified: Secondary | ICD-10-CM | POA: Diagnosis not present

## 2021-07-20 DIAGNOSIS — L814 Other melanin hyperpigmentation: Secondary | ICD-10-CM | POA: Diagnosis not present

## 2021-07-20 DIAGNOSIS — L578 Other skin changes due to chronic exposure to nonionizing radiation: Secondary | ICD-10-CM | POA: Diagnosis not present

## 2021-09-08 DIAGNOSIS — E785 Hyperlipidemia, unspecified: Secondary | ICD-10-CM | POA: Diagnosis not present

## 2021-09-08 DIAGNOSIS — I10 Essential (primary) hypertension: Secondary | ICD-10-CM | POA: Diagnosis not present

## 2021-09-08 DIAGNOSIS — R809 Proteinuria, unspecified: Secondary | ICD-10-CM | POA: Diagnosis not present

## 2021-09-30 DIAGNOSIS — Z23 Encounter for immunization: Secondary | ICD-10-CM | POA: Diagnosis not present

## 2021-10-06 DIAGNOSIS — M810 Age-related osteoporosis without current pathological fracture: Secondary | ICD-10-CM | POA: Diagnosis not present

## 2021-10-06 DIAGNOSIS — Z23 Encounter for immunization: Secondary | ICD-10-CM | POA: Diagnosis not present

## 2021-10-06 DIAGNOSIS — I951 Orthostatic hypotension: Secondary | ICD-10-CM | POA: Diagnosis not present

## 2021-10-06 DIAGNOSIS — I10 Essential (primary) hypertension: Secondary | ICD-10-CM | POA: Diagnosis not present

## 2021-10-06 DIAGNOSIS — E785 Hyperlipidemia, unspecified: Secondary | ICD-10-CM | POA: Diagnosis not present

## 2021-10-06 DIAGNOSIS — Z0000 Encounter for general adult medical examination without abnormal findings: Secondary | ICD-10-CM | POA: Diagnosis not present

## 2021-10-15 DIAGNOSIS — M2011 Hallux valgus (acquired), right foot: Secondary | ICD-10-CM | POA: Diagnosis not present

## 2021-10-15 DIAGNOSIS — I70203 Unspecified atherosclerosis of native arteries of extremities, bilateral legs: Secondary | ICD-10-CM | POA: Diagnosis not present

## 2021-10-15 DIAGNOSIS — M2041 Other hammer toe(s) (acquired), right foot: Secondary | ICD-10-CM | POA: Diagnosis not present

## 2021-10-15 DIAGNOSIS — M2012 Hallux valgus (acquired), left foot: Secondary | ICD-10-CM | POA: Diagnosis not present

## 2021-10-15 DIAGNOSIS — M2042 Other hammer toe(s) (acquired), left foot: Secondary | ICD-10-CM | POA: Diagnosis not present

## 2021-11-02 DIAGNOSIS — M2011 Hallux valgus (acquired), right foot: Secondary | ICD-10-CM | POA: Diagnosis not present

## 2021-11-02 DIAGNOSIS — M2012 Hallux valgus (acquired), left foot: Secondary | ICD-10-CM | POA: Diagnosis not present

## 2021-11-02 DIAGNOSIS — M2042 Other hammer toe(s) (acquired), left foot: Secondary | ICD-10-CM | POA: Diagnosis not present

## 2021-11-02 DIAGNOSIS — M2041 Other hammer toe(s) (acquired), right foot: Secondary | ICD-10-CM | POA: Diagnosis not present

## 2021-12-01 DIAGNOSIS — Z961 Presence of intraocular lens: Secondary | ICD-10-CM | POA: Diagnosis not present

## 2021-12-01 DIAGNOSIS — H401132 Primary open-angle glaucoma, bilateral, moderate stage: Secondary | ICD-10-CM | POA: Diagnosis not present

## 2021-12-01 DIAGNOSIS — H3321 Serous retinal detachment, right eye: Secondary | ICD-10-CM | POA: Diagnosis not present

## 2021-12-01 DIAGNOSIS — T85398D Other mechanical complication of other ocular prosthetic devices, implants and grafts, subsequent encounter: Secondary | ICD-10-CM | POA: Diagnosis not present

## 2021-12-14 DIAGNOSIS — M810 Age-related osteoporosis without current pathological fracture: Secondary | ICD-10-CM | POA: Diagnosis not present

## 2021-12-31 DIAGNOSIS — M810 Age-related osteoporosis without current pathological fracture: Secondary | ICD-10-CM | POA: Diagnosis not present

## 2022-01-22 DIAGNOSIS — Z23 Encounter for immunization: Secondary | ICD-10-CM | POA: Diagnosis not present

## 2022-02-08 DIAGNOSIS — M2012 Hallux valgus (acquired), left foot: Secondary | ICD-10-CM | POA: Diagnosis not present

## 2022-02-08 DIAGNOSIS — M2042 Other hammer toe(s) (acquired), left foot: Secondary | ICD-10-CM | POA: Diagnosis not present

## 2022-02-08 DIAGNOSIS — L84 Corns and callosities: Secondary | ICD-10-CM | POA: Diagnosis not present

## 2022-02-08 DIAGNOSIS — M2011 Hallux valgus (acquired), right foot: Secondary | ICD-10-CM | POA: Diagnosis not present

## 2022-02-08 DIAGNOSIS — M2041 Other hammer toe(s) (acquired), right foot: Secondary | ICD-10-CM | POA: Diagnosis not present

## 2022-02-23 DIAGNOSIS — Z23 Encounter for immunization: Secondary | ICD-10-CM | POA: Diagnosis not present

## 2022-03-09 DIAGNOSIS — M2012 Hallux valgus (acquired), left foot: Secondary | ICD-10-CM | POA: Diagnosis not present

## 2022-03-09 DIAGNOSIS — M2042 Other hammer toe(s) (acquired), left foot: Secondary | ICD-10-CM | POA: Diagnosis not present

## 2022-03-09 DIAGNOSIS — M21961 Unspecified acquired deformity of right lower leg: Secondary | ICD-10-CM | POA: Diagnosis not present

## 2022-03-09 DIAGNOSIS — L03032 Cellulitis of left toe: Secondary | ICD-10-CM | POA: Diagnosis not present

## 2022-03-09 DIAGNOSIS — L84 Corns and callosities: Secondary | ICD-10-CM | POA: Diagnosis not present

## 2022-03-09 DIAGNOSIS — M2011 Hallux valgus (acquired), right foot: Secondary | ICD-10-CM | POA: Diagnosis not present

## 2022-03-09 DIAGNOSIS — M2041 Other hammer toe(s) (acquired), right foot: Secondary | ICD-10-CM | POA: Diagnosis not present

## 2022-04-14 DIAGNOSIS — L851 Acquired keratosis [keratoderma] palmaris et plantaris: Secondary | ICD-10-CM | POA: Diagnosis not present

## 2022-04-14 DIAGNOSIS — B351 Tinea unguium: Secondary | ICD-10-CM | POA: Diagnosis not present

## 2022-04-14 DIAGNOSIS — M7740 Metatarsalgia, unspecified foot: Secondary | ICD-10-CM | POA: Diagnosis not present

## 2022-06-28 DIAGNOSIS — M810 Age-related osteoporosis without current pathological fracture: Secondary | ICD-10-CM | POA: Diagnosis not present

## 2022-07-12 DIAGNOSIS — M810 Age-related osteoporosis without current pathological fracture: Secondary | ICD-10-CM | POA: Diagnosis not present

## 2022-07-17 ENCOUNTER — Emergency Department (HOSPITAL_BASED_OUTPATIENT_CLINIC_OR_DEPARTMENT_OTHER)
Admission: EM | Admit: 2022-07-17 | Discharge: 2022-07-17 | Disposition: A | Discharge status: Home / Self Care | Payer: Medicare Other | Attending: Emergency Medicine | Admitting: Emergency Medicine

## 2022-07-17 ENCOUNTER — Encounter (HOSPITAL_BASED_OUTPATIENT_CLINIC_OR_DEPARTMENT_OTHER): Payer: Self-pay | Admitting: Emergency Medicine

## 2022-07-17 ENCOUNTER — Emergency Department (HOSPITAL_BASED_OUTPATIENT_CLINIC_OR_DEPARTMENT_OTHER): Payer: Medicare Other

## 2022-07-17 DIAGNOSIS — R079 Chest pain, unspecified: Secondary | ICD-10-CM

## 2022-07-17 DIAGNOSIS — I10 Essential (primary) hypertension: Secondary | ICD-10-CM | POA: Diagnosis not present

## 2022-07-17 DIAGNOSIS — E876 Hypokalemia: Secondary | ICD-10-CM | POA: Insufficient documentation

## 2022-07-17 DIAGNOSIS — R0789 Other chest pain: Secondary | ICD-10-CM | POA: Insufficient documentation

## 2022-07-17 LAB — BASIC METABOLIC PANEL
Anion gap: 10 (ref 5–15)
BUN: 17 mg/dL (ref 8–23)
CO2: 27 mmol/L (ref 22–32)
Calcium: 9.6 mg/dL (ref 8.9–10.3)
Chloride: 103 mmol/L (ref 98–111)
Creatinine, Ser: 0.91 mg/dL (ref 0.44–1.00)
GFR, Estimated: >60 mL/min (ref >60)
Glucose, Bld: 111 mg/dL — ABNORMAL HIGH (ref 70–99)
Potassium: 3.1 mmol/L — ABNORMAL LOW (ref 3.5–5.1)
Sodium: 140 mmol/L (ref 135–145)

## 2022-07-17 LAB — CBC WITH DIFFERENTIAL/PLATELET
Abs Immature Granulocytes: 0.03 10*3/uL (ref 0.00–0.07)
Basophils Absolute: 0.1 10*3/uL (ref 0.0–0.1)
Basophils Relative: 1 %
Eosinophils Absolute: 0.1 10*3/uL (ref 0.0–0.5)
Eosinophils Relative: 2 %
HCT: 45.2 % (ref 36.0–46.0)
Hemoglobin: 14.9 g/dL (ref 12.0–15.0)
Immature Granulocytes: 0 %
Lymphocytes Relative: 21 %
Lymphs Abs: 1.8 10*3/uL (ref 0.7–4.0)
MCH: 30.9 pg (ref 26.0–34.0)
MCHC: 33 g/dL (ref 30.0–36.0)
MCV: 93.8 fL (ref 80.0–100.0)
Monocytes Absolute: 0.9 10*3/uL (ref 0.1–1.0)
Monocytes Relative: 10 %
Neutro Abs: 5.6 10*3/uL (ref 1.7–7.7)
Neutrophils Relative %: 66 %
Platelets: 263 10*3/uL (ref 150–400)
RBC: 4.82 MIL/uL (ref 3.87–5.11)
RDW: 12.7 % (ref 11.5–15.5)
WBC: 8.5 10*3/uL (ref 4.0–10.5)
nRBC: 0 % (ref 0.0–0.2)

## 2022-07-17 LAB — TROPONIN I (HIGH SENSITIVITY)
Troponin I (High Sensitivity): 7 ng/L (ref <18)
Troponin I (High Sensitivity): 8 ng/L (ref <18)

## 2022-07-17 MED ORDER — METHOCARBAMOL 500 MG PO TABS: 500.0000 mg | ORAL_TABLET | Freq: Every day | ORAL | 0 refills | Status: AC

## 2022-07-17 MED ORDER — NITROGLYCERIN 0.4 MG SL SUBL
0.4000 mg | SUBLINGUAL_TABLET | SUBLINGUAL | Status: DC | PRN
  Administered 2022-07-17: 0.4 mg via SUBLINGUAL
  Filled 2022-07-17: qty 1

## 2022-07-17 MED ORDER — ASPIRIN 81 MG PO CHEW
324.0000 mg | CHEWABLE_TABLET | Freq: Once | ORAL | Status: AC
  Administered 2022-07-17: 324 mg via ORAL
  Filled 2022-07-17: qty 4

## 2022-07-17 MED ORDER — ACETAMINOPHEN 500 MG PO TABS: 1000.0000 mg | ORAL_TABLET | Freq: Once | ORAL | Status: DC

## 2022-07-17 MED ORDER — FAMOTIDINE 20 MG PO TABS
20.0000 mg | ORAL_TABLET | Freq: Once | ORAL | Status: AC
  Administered 2022-07-17: 20 mg via ORAL
  Filled 2022-07-17: qty 1

## 2022-07-17 MED ORDER — KETOROLAC TROMETHAMINE 15 MG/ML IJ SOLN
15.0000 mg | Freq: Once | INTRAMUSCULAR | Status: AC
  Administered 2022-07-17: 15 mg via INTRAVENOUS
  Filled 2022-07-17: qty 1

## 2022-07-17 MED ORDER — POTASSIUM CHLORIDE ER 10 MEQ PO TBCR: 10.0000 meq | EXTENDED_RELEASE_TABLET | Freq: Two times a day (BID) | ORAL | 0 refills | Status: AC

## 2022-07-17 NOTE — ED Notes (Signed)
Discharge instructions provided by edp were reinforced to pt including follow up with cardiology. Pt verbalized understanding with no additional questions at this time. Pt to go home with friend at bedside.

## 2022-07-17 NOTE — ED Triage Notes (Signed)
Left side chest pain pain into shoulder left arm. Started last night. Denies SOB. Ok at rest, worse with activity standing bending

## 2022-07-17 NOTE — ED Provider Notes (Signed)
[facility] Provider Note   CSN: [id number] Arrival date & time: 07/17/22  1610     History Chief Complaint  Patient presents with   Chest Pain    HPI Christine Davila is a 88 y.o. female presenting for chief complaint of atypical chest pain.  She states that she started having point tenderness over her left anterior chest wall last night when she was reaching to grab something.  She denies fevers or chills, nausea vomiting causing shortness of breath.  Otherwise ambulatory tolerating intake.  No known sick contacts.  Denies any cardiac history.  History hypertension though she is compliant on her medications.  No medications prior to arrival.   Patient's recorded medical, surgical, social, medication list and allergies were reviewed in the Snapshot window as part of the initial history.   Review of Systems   Review of Systems  Constitutional:  Negative for chills and fever.  HENT:  Negative for ear pain and sore throat.   Eyes:  Negative for pain and visual disturbance.  Respiratory:  Negative for cough and shortness of breath.   Cardiovascular:  Positive for chest pain. Negative for palpitations.  Gastrointestinal:  Negative for abdominal pain and vomiting.  Genitourinary:  Negative for dysuria and hematuria.  Musculoskeletal:  Negative for arthralgias and back pain.  Skin:  Negative for color change and rash.  Neurological:  Negative for seizures and syncope.  All other systems reviewed and are negative.   Physical Exam Updated Vital Signs BP (!) 169/73   Pulse 76   Temp 97.7 F (36.5 C)   Resp 18   SpO2 94%  Physical Exam Vitals and nursing note reviewed.  Constitutional:      General: She is not in acute distress.    Appearance: She is well-developed.  HENT:     Head: Normocephalic and atraumatic.  Eyes:     Conjunctiva/sclera: Conjunctivae normal.  Cardiovascular:     Rate and Rhythm: Normal rate and regular  rhythm.     Heart sounds: No murmur heard. Pulmonary:     Effort: Pulmonary effort is normal. No respiratory distress.     Breath sounds: Normal breath sounds.  Chest:     Chest wall: Tenderness (Exquisite tenderness to palpation of the left anterior chest.) present.  Abdominal:     General: There is no distension.     Palpations: Abdomen is soft.     Tenderness: There is no abdominal tenderness. There is no right CVA tenderness or left CVA tenderness.  Musculoskeletal:        General: No swelling or tenderness. Normal range of motion.     Cervical back: Neck supple.  Skin:    General: Skin is warm and dry.  Neurological:     General: No focal deficit present.     Mental Status: She is alert and oriented to person, place, and time. Mental status is at baseline.     Cranial Nerves: No cranial nerve deficit.      ED Course/ Medical Decision Making/ A&P    Procedures Procedures   Medications Ordered in ED Medications  nitroGLYCERIN (NITROSTAT) SL tablet 0.4 mg (0.4 mg Sublingual Given 07/17/22 1658)  aspirin chewable tablet 324 mg (324 mg Oral Given 07/17/22 1657)  famotidine (PEPCID) tablet 20 mg (20 mg Oral Given 07/17/22 1705)  ketorolac (TORADOL) 15 MG/ML injection 15 mg (15 mg Intravenous Given 07/17/22 1801)   Medical Decision Making: Christine Davila is a 88  y.o. female who presented to the ED today with chest pain, detailed above.  Based on patient's comorbidities, patient has a heart score of 3.    Patient's presentation is complicated by their history of advanced age, hypertension.  Patient placed on continuous vitals and telemetry monitoring while in ED which was reviewed periodically.  Complete initial physical exam performed, notably the patient was hemodynamically stable no acute distress.   Reviewed and confirmed nursing documentation for past medical history, family history, social history.    Initial Assessment: With the patient's presentation of left-sided  chest pain, most likely diagnosis is musculoskeletal chest pain versus GERD, although ACS remains on the differential. Other diagnoses were considered including (but not limited to) pulmonary embolism, community-acquired pneumonia, aortic dissection, pneumothorax, underlying bony abnormality, anemia. These are considered less likely due to history of present illness and physical exam findings.    In particular, concerning pulmonary embolism: they deny malignancy, recent surgery, history of DVT, or calf tenderness leading to a low risk Wells score. Aortic Dissection also reconsidered but seems less likely based on the location, quality, onset, and severity of symptoms in this case. Patient has a lack of serious comorbidities for this condition including a lack of Smoking. Patient also has a lack of underlying history of AD or TAA.  This is most consistent with an acute life/limb threatening illness complicated by underlying chronic conditions.   Initial Plan: Evaluate for ACS with single troponin and EKG evaluated as below  Evaluate for dissection, bony abnormality, or pneumonia with chest x-ray and screening laboratory evaluation including CBC, BMP  Further evaluation for pulmonary embolism not indicated at this time based on patient's PERC and Wells score.  Further evaluation for Thoracic Aortic Dissection not indicated at this time based on patient's clinical history and PE findings.   Initial Study Results: EKG was reviewed independently. Rate, rhythm, axis, intervals all examined and without medically relevant abnormality. ST segments without concerns for elevations.    Laboratory  Serial troponin demonstrated no acute abnormality  CBC and BMP without obvious metabolic or inflammatory abnormalities requiring further evaluation   Radiology  DG Chest Portable 1 View  Result Date: 07/17/2022 CLINICAL DATA:  Left-sided chest pain that radiates to the shoulder and left arm. EXAM: PORTABLE CHEST  1 VIEW COMPARISON:  None Available. FINDINGS: Heart size and mediastinal contours are unremarkable. Aortic atherosclerotic calcifications. No pleural fluid or airspace disease. No interstitial edema. No acute osseous findings. IMPRESSION: No active disease. Electronically Signed   By: Kerby Moors M.D.   On: 07/17/2022 16:51    Final Assessment and Plan: Given low heart score (3), resolution of symptoms after Toradol and atypical description of symptoms, this is unlikely to be ACS. Patient initially had no improvement of symptoms with nitroglycerin prior to administration of Toradol. More likely musculoskeletal in nature.  Patient to follow-up outpatient with her primary care provider/cardiologist.  States that she is already called their office and will plan to follow-up next week.  Patient states that she would really like to trial a muscle relaxer therapy.  We discussed risk of falls but patient states she has had substantial benefit and that she will only use at night.  She expressed understanding of risk of falls and would like to try with low-dose of Robaxin.  This was prescribed instructed her precautions were reinforced regarding interval worsening/importance of following up with cardiology and patient expressed understanding.  Patient discharged with no acute abnormalities. Disposition:  I have considered need for  hospitalization, however, considering all of the above, I believe this patient is stable for discharge at this time.  Patient/family educated about specific return precautions for given chief complaint and symptoms.  Patient/family educated about follow-up with PCP.     Patient/family expressed understanding of return precautions and need for follow-up. Patient spoken to regarding all imaging and laboratory results and appropriate follow up for these results. All education provided in verbal form with additional information in written form. Time was allowed for answering of patient  questions. Patient discharged.    Emergency Department Medication Summary:   Medications  nitroGLYCERIN (NITROSTAT) SL tablet 0.4 mg (0.4 mg Sublingual Given 07/17/22 1658)  aspirin chewable tablet 324 mg (324 mg Oral Given 07/17/22 1657)  famotidine (PEPCID) tablet 20 mg (20 mg Oral Given 07/17/22 1705)  ketorolac (TORADOL) 15 MG/ML injection 15 mg (15 mg Intravenous Given 07/17/22 1801)                  Clinical Impression:  1. Chest pain, unspecified type      Discharge   Final Clinical Impression(s) / ED Diagnoses Final diagnoses:  Chest pain, unspecified type    Rx / DC Orders ED Discharge Orders          Ordered    potassium chloride (KLOR-CON) 10 MEQ tablet  2 times daily        07/17/22 1856    methocarbamol (ROBAXIN) 500 MG tablet  Daily at bedtime        07/17/22 1856              Tretha Sciara, MD 07/17/22 1858

## 2022-07-21 ENCOUNTER — Telehealth: Payer: Self-pay

## 2022-07-21 NOTE — Telephone Encounter (Signed)
     Patient  visit on 07/17/2022  at [facility] was for chest pain.  Have you been able to follow up with your primary care physician? Yes  The patient was or was not able to obtain any needed medicine or equipment. Patient was able to obtain medication.  Are there diet recommendations that you are having difficulty following? No  Patient expresses understanding of discharge instructions and education provided has no other needs at this time. Yes   [facility] Resource Care Guide   [email or web address]  ?? [id number]   [email or web address]: [email or web address]  [email or web address]

## 2022-08-10 DIAGNOSIS — L820 Inflamed seborrheic keratosis: Secondary | ICD-10-CM | POA: Diagnosis not present

## 2022-08-10 DIAGNOSIS — L309 Dermatitis, unspecified: Secondary | ICD-10-CM | POA: Diagnosis not present

## 2022-08-10 DIAGNOSIS — L570 Actinic keratosis: Secondary | ICD-10-CM | POA: Diagnosis not present

## 2022-08-10 DIAGNOSIS — L814 Other melanin hyperpigmentation: Secondary | ICD-10-CM | POA: Diagnosis not present

## 2022-08-10 DIAGNOSIS — L821 Other seborrheic keratosis: Secondary | ICD-10-CM | POA: Diagnosis not present

## 2022-08-10 DIAGNOSIS — L578 Other skin changes due to chronic exposure to nonionizing radiation: Secondary | ICD-10-CM | POA: Diagnosis not present

## 2022-08-10 DIAGNOSIS — D485 Neoplasm of uncertain behavior of skin: Secondary | ICD-10-CM | POA: Diagnosis not present

## 2022-08-10 DIAGNOSIS — Z86018 Personal history of other benign neoplasm: Secondary | ICD-10-CM | POA: Diagnosis not present

## 2022-08-10 DIAGNOSIS — D225 Melanocytic nevi of trunk: Secondary | ICD-10-CM | POA: Diagnosis not present

## 2022-08-12 DIAGNOSIS — L03032 Cellulitis of left toe: Secondary | ICD-10-CM | POA: Diagnosis not present

## 2022-08-12 DIAGNOSIS — L603 Nail dystrophy: Secondary | ICD-10-CM | POA: Diagnosis not present

## 2022-08-12 DIAGNOSIS — L03031 Cellulitis of right toe: Secondary | ICD-10-CM | POA: Diagnosis not present

## 2022-08-12 DIAGNOSIS — L02612 Cutaneous abscess of left foot: Secondary | ICD-10-CM | POA: Diagnosis not present

## 2022-08-26 DIAGNOSIS — L02611 Cutaneous abscess of right foot: Secondary | ICD-10-CM | POA: Diagnosis not present

## 2022-09-06 DIAGNOSIS — L603 Nail dystrophy: Secondary | ICD-10-CM | POA: Diagnosis not present

## 2022-09-06 DIAGNOSIS — L02611 Cutaneous abscess of right foot: Secondary | ICD-10-CM | POA: Diagnosis not present

## 2022-09-06 DIAGNOSIS — L84 Corns and callosities: Secondary | ICD-10-CM | POA: Diagnosis not present

## 2022-09-06 DIAGNOSIS — L03032 Cellulitis of left toe: Secondary | ICD-10-CM | POA: Diagnosis not present

## 2022-09-06 DIAGNOSIS — L03031 Cellulitis of right toe: Secondary | ICD-10-CM | POA: Diagnosis not present

## 2022-09-27 DIAGNOSIS — L03032 Cellulitis of left toe: Secondary | ICD-10-CM | POA: Diagnosis not present

## 2022-09-27 DIAGNOSIS — L03031 Cellulitis of right toe: Secondary | ICD-10-CM | POA: Diagnosis not present

## 2022-10-13 DIAGNOSIS — L03031 Cellulitis of right toe: Secondary | ICD-10-CM | POA: Diagnosis not present

## 2022-10-13 DIAGNOSIS — L03032 Cellulitis of left toe: Secondary | ICD-10-CM | POA: Diagnosis not present
